# Patient Record
Sex: Female | Born: 1980 | Race: Black or African American | Hispanic: No | Marital: Married | Smoking: Never smoker
Health system: Southern US, Community
[De-identification: ages and names within clinical notes are randomized; demographics above are authoritative.]

## PROBLEM LIST (undated history)

## (undated) ENCOUNTER — Inpatient Hospital Stay (HOSPITAL_COMMUNITY): Payer: Self-pay

## (undated) DIAGNOSIS — K219 Gastro-esophageal reflux disease without esophagitis: Secondary | ICD-10-CM

## (undated) DIAGNOSIS — O139 Gestational [pregnancy-induced] hypertension without significant proteinuria, unspecified trimester: Secondary | ICD-10-CM

## (undated) DIAGNOSIS — D649 Anemia, unspecified: Secondary | ICD-10-CM

---
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
## 2009-05-21 ENCOUNTER — Emergency Department (HOSPITAL_BASED_OUTPATIENT_CLINIC_OR_DEPARTMENT_OTHER): Admission: EM | Admit: 2009-05-21 | Discharge: 2009-05-21 | Payer: Self-pay | Admitting: Emergency Medicine

## 2010-10-02 ENCOUNTER — Emergency Department (HOSPITAL_BASED_OUTPATIENT_CLINIC_OR_DEPARTMENT_OTHER)
Admission: EM | Admit: 2010-10-02 | Discharge: 2010-10-02 | Disposition: A | Discharge status: Home / Self Care | Payer: BC Managed Care – PPO | Attending: Emergency Medicine | Admitting: Emergency Medicine

## 2010-10-02 DIAGNOSIS — B9789 Other viral agents as the cause of diseases classified elsewhere: Secondary | ICD-10-CM | POA: Insufficient documentation

## 2010-10-02 DIAGNOSIS — R509 Fever, unspecified: Secondary | ICD-10-CM | POA: Insufficient documentation

## 2011-08-30 ENCOUNTER — Encounter (HOSPITAL_COMMUNITY): Payer: Self-pay | Admitting: *Deleted

## 2011-09-01 ENCOUNTER — Encounter (HOSPITAL_COMMUNITY): Payer: Self-pay | Admitting: Pharmacist

## 2011-09-09 ENCOUNTER — Other Ambulatory Visit: Payer: Self-pay | Admitting: Obstetrics & Gynecology

## 2011-09-09 NOTE — H&P (Incomplete)
Latonyia NESBIT is an 31 y.o. female. ***  Pertinent Gynecological History: Menses: {menses:16152} Bleeding: {uterine bleeding:32112} Contraception: {contraception:5051} DES exposure: {denies/unknown:32108} Blood transfusions: {none:33079} Sexually transmitted diseases: {std risk:32110} Previous GYN Procedures: {previous procedures:3041388}  Last mammogram: {normal/abnormal***:32111} Date: *** Last pap: {normal/abnormal***:32111} Date: *** OB History: G***, P***   Menstrual History: Menarche age: *** Patient's last menstrual period was 07/13/2011.    No past medical history on file.  Past Surgical History  Procedure Date  . Cesarean section   . Breast enhancement surgery 2001    No family history on file.  Social History:  reports that she has never smoked. She does not have any smokeless tobacco history on file. She reports that she does not drink alcohol or use illicit drugs.  Allergies: No Known Allergies   (Not in a hospital admission)  ROS  Last menstrual period 07/13/2011. Physical Exam  No results found for this or any previous visit (from the past 24 hour(s)).  No results found.  Assessment/Plan: ***  Yasenia Reedy H. 09/09/2011, 5:32 PM

## 2011-09-09 NOTE — H&P (Signed)
Kimberly Douglas is an 31 y.o. female. Here for preoperative evaluation for removal of IUD. History of present illness: Kimberly Douglas is a 31-year-old gravida 1 para 1001 that recently started him on monanessa oral contraceptive combination hormones and cyclic. She did this to take advantage of the fact that she has an IUD that she wants removed. She is having no problems and is trying to lose weight as well.  The IUD itself is a string itself is not visual on exam no palpable. An ultrasound revealed small fibroids and that the IUD was in place but the string was 1.7 cm from the external os. The IUD was placed in January of 08. 2 attempts were made to remove this in the office initially with the string could not be visualized and a second time with the use of a IUD removal one C-section secondary to fetal heart rate deceleration instrument /hook. That is why we are to remove this in the operating room under hysteroscopic guidance and potentially ultrasound guidance.  The patient was counseled regarding the risks benefits and alternatives of the procedure.  The risks of general anesthesia to be further delineated per anesthesia, but do include a potential for a reaction that could be life-threatening, the potential for aspiration and subsequent pneumonia and prolonged hospital stay. The risks of the procedure itself to include infection, perforation, possible laparoscopy, possible damage to vessels with possible removal of the uterus to control bleeding and possibly been unsuccessful in removing the IUD. In the rare event of a transfusion she is aware of this being only used when necessary and the associated risks of HIV, hepatitis, another on and any unknown potential infections but also the potential for a transfusion reaction where her body rejects the blood when we think she needs it All questions were answered the patient wished to proceed. OB history: One C-section at term secondary to fetal heart rate  deceleration  GYN history: Denies history of sexually transmitted diseases. Last Pap smear January 2013 negative Past medical history 1 obesity BMI 37    2. 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 Family history her know free bleeders she denies cancer history of clotting disorders in her family. Paternal grandmother has congestive heart failure  Pertinent Gynecological History: Menses: flow is light Bleeding: na Contraception: IUD and OCP (estrogen/progesterone) DES exposure: denies Blood transfusions: none Sexually transmitted diseases: no past history Previous GYN Procedures: none  Last mammogram: na Date: na Last pap: normal Date: 1/13 OB History: G1, P1   Menstrual History: Menarche age: @14 Patient's last menstrual period was 07/13/2011.    No past medical history on file.  Past Surgical History  Procedure Date  . Cesarean section   . Breast enhancement surgery 2001    No family history on file.  Social History:  reports that she has never smoked. She does not have any smokeless tobacco history on file. She reports that she does not drink alcohol or use illicit drugs.  Allergies: No Known Allergies   (Not in a hospital admission)  Review of Systems  Constitutional: Negative for fever, chills and weight loss.  HENT: Negative for hearing loss, congestion and sore throat.   Eyes: Negative for blurred vision, pain and redness.  Respiratory: Negative for cough and shortness   of breath.   Cardiovascular: Negative for chest pain and palpitations.  Gastrointestinal: Negative for nausea, vomiting, diarrhea, constipation and blood in stool.  Genitourinary: Negative for dysuria and hematuria.  Musculoskeletal: Negative  for back pain.  Skin: Negative for rash.  Neurological: Negative for dizziness, tingling, seizures and headaches.  Psychiatric/Behavioral: Negative for depression and suicidal ideas.    Last menstrual period 07/13/2011. Physical Exam  Constitutional: She is oriented to person, place, and time. She appears well-developed and well-nourished.  HENT:  Head: Normocephalic and atraumatic.  Nose: Nose normal.  Mouth/Throat: Oropharynx is clear and moist.  Eyes: Conjunctivae and EOM are normal. Pupils are equal, round, and reactive to light. Right eye exhibits no discharge. Left eye exhibits no discharge.  Neck: Normal range of motion. Neck supple. No tracheal deviation present. No thyromegaly present.  Cardiovascular: Normal rate, regular rhythm and normal heart sounds.   Respiratory: Effort normal and breath sounds normal. No respiratory distress. She has no wheezes.  GI: Soft. She exhibits no distension and no mass. There is no tenderness. There is no rebound and no guarding.  Genitourinary: Vagina normal and uterus normal. No vaginal discharge found.  Musculoskeletal: Normal range of motion. She exhibits no edema.  Lymphadenopathy:    She has no cervical adenopathy.  Neurological: She is alert and oriented to person, place, and time. No cranial nerve deficit.  Skin: Skin is warm and dry. No rash noted.  Psychiatric: She has a normal mood and affect.    No results found for this or any previous visit (from the past 24 hour(s)).  No results found.  Assessment/Plan:   I UD complication as above Plan: Will remove the IUD in the operating room under hysteroscopic and possible ultrasound guidance necessary. I do not believe we'll need the ultrasound. I do not believe laparoscopy will be needed.  Manasvi Dickard H. 09/09/2011, 5:19 PM  

## 2011-09-14 ENCOUNTER — Encounter (HOSPITAL_COMMUNITY): Payer: Self-pay | Admitting: *Deleted

## 2011-09-14 ENCOUNTER — Other Ambulatory Visit: Payer: Self-pay | Admitting: Obstetrics and Gynecology

## 2011-09-14 ENCOUNTER — Encounter: Payer: Self-pay | Admitting: Obstetrics & Gynecology

## 2011-09-14 ENCOUNTER — Ambulatory Visit (HOSPITAL_COMMUNITY)
Admission: RE | Admit: 2011-09-14 | Discharge: 2011-09-14 | Disposition: A | Discharge status: Home Health | Payer: Medicaid Other | Source: Ambulatory Visit | Attending: Obstetrics and Gynecology | Admitting: Obstetrics and Gynecology

## 2011-09-14 ENCOUNTER — Encounter (HOSPITAL_COMMUNITY): Payer: Self-pay | Admitting: Anesthesiology

## 2011-09-14 ENCOUNTER — Encounter (HOSPITAL_COMMUNITY)
Admission: RE | Disposition: A | Discharge status: Home Health | Payer: Self-pay | Source: Ambulatory Visit | Attending: Obstetrics and Gynecology

## 2011-09-14 ENCOUNTER — Other Ambulatory Visit: Payer: Self-pay | Admitting: Obstetrics & Gynecology

## 2011-09-14 ENCOUNTER — Ambulatory Visit (HOSPITAL_COMMUNITY): Payer: Medicaid Other | Admitting: Anesthesiology

## 2011-09-14 DIAGNOSIS — T839XXA Unspecified complication of genitourinary prosthetic device, implant and graft, initial encounter: Secondary | ICD-10-CM

## 2011-09-14 DIAGNOSIS — Z30432 Encounter for removal of intrauterine contraceptive device: Secondary | ICD-10-CM | POA: Insufficient documentation

## 2011-09-14 HISTORY — PX: HYSTEROSCOPY WITH D & C: SHX1775

## 2011-09-14 HISTORY — PX: IUD REMOVAL: SHX5392

## 2011-09-14 LAB — CBC
HCT: 40 % (ref 36.0–46.0)
Hemoglobin: 13.2 g/dL (ref 12.0–15.0)
MCV: 93.5 fL (ref 78.0–100.0)
RBC: 4.28 MIL/uL (ref 3.87–5.11)
WBC: 7 10*3/uL (ref 4.0–10.5)

## 2011-09-14 SURGERY — DILATATION AND CURETTAGE /HYSTEROSCOPY
Anesthesia: Choice

## 2011-09-14 SURGERY — HYSTEROSCOPY
Anesthesia: Choice

## 2011-09-14 MED ORDER — FENTANYL CITRATE 0.05 MG/ML IJ SOLN
INTRAMUSCULAR | Status: AC
  Filled 2011-09-14: qty 2

## 2011-09-14 MED ORDER — LIDOCAINE HCL 2 % IJ SOLN
INTRAMUSCULAR | Status: DC | PRN
  Administered 2011-09-14: 20 mL

## 2011-09-14 MED ORDER — PROPOFOL 10 MG/ML IV EMUL
INTRAVENOUS | Status: DC | PRN
  Administered 2011-09-14: 200 mg via INTRAVENOUS

## 2011-09-14 MED ORDER — KETOROLAC TROMETHAMINE 30 MG/ML IJ SOLN
INTRAMUSCULAR | Status: DC | PRN
  Administered 2011-09-14: 30 mg via INTRAVENOUS

## 2011-09-14 MED ORDER — PROPOFOL 10 MG/ML IV EMUL
INTRAVENOUS | Status: AC
  Filled 2011-09-14: qty 20

## 2011-09-14 MED ORDER — KETOROLAC TROMETHAMINE 30 MG/ML IJ SOLN: 15.0000 mg | Freq: Once | INTRAMUSCULAR | Status: DC | PRN

## 2011-09-14 MED ORDER — DOXYCYCLINE HYCLATE 100 MG IV SOLR
200.0000 mg | INTRAVENOUS | Status: DC | PRN
  Administered 2011-09-14: 200 mg via INTRAVENOUS

## 2011-09-14 MED ORDER — DEXAMETHASONE SODIUM PHOSPHATE 4 MG/ML IJ SOLN
INTRAMUSCULAR | Status: DC | PRN
  Administered 2011-09-14: 10 mg via INTRAVENOUS

## 2011-09-14 MED ORDER — FENTANYL CITRATE 0.05 MG/ML IJ SOLN
INTRAMUSCULAR | Status: DC | PRN
  Administered 2011-09-14: 100 ug via INTRAVENOUS

## 2011-09-14 MED ORDER — FENTANYL CITRATE 0.05 MG/ML IJ SOLN: 25.0000 ug | INTRAMUSCULAR | Status: DC | PRN

## 2011-09-14 MED ORDER — MUPIROCIN 2 % EX OINT: TOPICAL_OINTMENT | Freq: Two times a day (BID) | CUTANEOUS | Status: DC

## 2011-09-14 MED ORDER — LACTATED RINGERS IV SOLN
INTRAVENOUS | Status: DC
  Administered 2011-09-14 (×2): via INTRAVENOUS

## 2011-09-14 MED ORDER — ACETAMINOPHEN 325 MG PO TABS: 325.0000 mg | ORAL_TABLET | ORAL | Status: DC | PRN

## 2011-09-14 MED ORDER — LIDOCAINE HCL (CARDIAC) 20 MG/ML IV SOLN
INTRAVENOUS | Status: AC
  Filled 2011-09-14: qty 5

## 2011-09-14 MED ORDER — ONDANSETRON HCL 4 MG/2ML IJ SOLN
INTRAMUSCULAR | Status: AC
  Filled 2011-09-14: qty 2

## 2011-09-14 MED ORDER — MIDAZOLAM HCL 2 MG/2ML IJ SOLN
INTRAMUSCULAR | Status: AC
  Filled 2011-09-14: qty 2

## 2011-09-14 MED ORDER — ONDANSETRON HCL 4 MG/2ML IJ SOLN
INTRAMUSCULAR | Status: DC | PRN
  Administered 2011-09-14: 4 mg via INTRAVENOUS

## 2011-09-14 MED ORDER — LIDOCAINE HCL (CARDIAC) 20 MG/ML IV SOLN
INTRAVENOUS | Status: DC | PRN
  Administered 2011-09-14: 100 mg via INTRAVENOUS

## 2011-09-14 MED ORDER — DOXYCYCLINE HYCLATE 100 MG IV SOLR: 200.0000 mg | INTRAVENOUS | Status: AC

## 2011-09-14 MED ORDER — DEXAMETHASONE SODIUM PHOSPHATE 10 MG/ML IJ SOLN
INTRAMUSCULAR | Status: AC
  Filled 2011-09-14: qty 1

## 2011-09-14 MED ORDER — MIDAZOLAM HCL 5 MG/5ML IJ SOLN
INTRAMUSCULAR | Status: DC | PRN
  Administered 2011-09-14: 2 mg via INTRAVENOUS

## 2011-09-14 MED ORDER — LIDOCAINE HCL 2 % IJ SOLN
INTRAMUSCULAR | Status: AC
  Filled 2011-09-14: qty 1

## 2011-09-14 MED ORDER — DOXYCYCLINE HYCLATE 100 MG IV SOLR
200.0000 mg | INTRAVENOUS | Status: DC
  Filled 2011-09-14: qty 200

## 2011-09-14 MED ORDER — KETOROLAC TROMETHAMINE 60 MG/2ML IM SOLN
INTRAMUSCULAR | Status: AC
  Filled 2011-09-14: qty 2

## 2011-09-14 SURGICAL SUPPLY — 13 items
CANISTER SUCTION 2500CC (MISCELLANEOUS) ×2 IMPLANT
CATH ROBINSON RED A/P 16FR (CATHETERS) ×2 IMPLANT
CLOTH BEACON ORANGE TIMEOUT ST (SAFETY) ×2 IMPLANT
CONTAINER PREFILL 10% NBF 60ML (FORM) ×4 IMPLANT
DILATOR CANAL MILEX (MISCELLANEOUS) IMPLANT
GLOVE BIO SURGEON STRL SZ7 (GLOVE) ×2 IMPLANT
GLOVE BIOGEL PI IND STRL 7.0 (GLOVE) ×2 IMPLANT
GLOVE BIOGEL PI INDICATOR 7.0 (GLOVE) ×2
GOWN PREVENTION PLUS LG XLONG (DISPOSABLE) ×2 IMPLANT
GOWN STRL REIN XL XLG (GOWN DISPOSABLE) ×2 IMPLANT
PACK HYSTEROSCOPY LF (CUSTOM PROCEDURE TRAY) ×2 IMPLANT
TOWEL OR 17X24 6PK STRL BLUE (TOWEL DISPOSABLE) ×4 IMPLANT
WATER STERILE IRR 1000ML POUR (IV SOLUTION) ×2 IMPLANT

## 2011-09-14 NOTE — Discharge Instructions (Signed)

## 2011-09-14 NOTE — H&P (Unsigned)
     ROS  Physical Exam      Kimberly Douglas H. 09/14/2011, 8:50 AM

## 2011-09-14 NOTE — Anesthesia Procedure Notes (Signed)
Procedure Name: LMA Insertion Date/Time: 09/14/2011 12:36 PM Performed by: Aragon Scarantino MARIE Pre-anesthesia Checklist: Patient identified, Patient being monitored, Emergency Drugs available, Suction available and Timeout performed Patient Re-evaluated:Patient Re-evaluated prior to inductionOxygen Delivery Method: Circle system utilized Preoxygenation: Pre-oxygenation with 100% oxygen Intubation Type: IV induction Ventilation: Mask ventilation without difficulty LMA: LMA inserted LMA Size: 4.0 Number of attempts: 1 Dental Injury: Teeth and Oropharynx as per pre-operative assessment

## 2011-09-14 NOTE — Anesthesia Postprocedure Evaluation (Signed)
Anesthesia Post Note  Patient: Kimberly Douglas  Procedure(s) Performed: Procedure(s) (LRB): DILATATION AND CURETTAGE /HYSTEROSCOPY (N/A) INTRAUTERINE DEVICE (IUD) REMOVAL (N/A)  Anesthesia type: GA  Patient location: PACU  Post pain: Pain level controlled  Post assessment: Post-op Vital signs reviewed  Last Vitals:  Filed Vitals:   09/14/11 1043  BP: 135/92  Pulse: 74  Temp: 37.1 C  Resp: 18    Post vital signs: Reviewed  Level of consciousness: sedated  Complications: No apparent anesthesia complications

## 2011-09-14 NOTE — Transfer of Care (Signed)
Immediate Anesthesia Transfer of Care Note  Patient: Kimberly Douglas  Procedure(s) Performed: Procedure(s) (LRB): DILATATION AND CURETTAGE /HYSTEROSCOPY (N/A) INTRAUTERINE DEVICE (IUD) REMOVAL (N/A)  Patient Location: PACU  Anesthesia Type: General  Level of Consciousness: awake, alert  and oriented  Airway & Oxygen Therapy: Patient Spontanous Breathing and Patient connected to nasal cannula oxygen  Post-op Assessment: Report given to PACU RN, Post -op Vital signs reviewed and stable and Patient able to stick tongue midline  Post vital signs: Reviewed and stable  Complications: No apparent anesthesia complications

## 2011-09-14 NOTE — Anesthesia Preprocedure Evaluation (Signed)
Anesthesia Evaluation  Patient identified by MRN, date of birth, ID band Patient awake    Reviewed: Allergy & Precautions, H&P , Patient's Chart, lab work & pertinent test results, reviewed documented beta blocker date and time   History of Anesthesia Complications Negative for: history of anesthetic complications  Airway Mallampati: II TM Distance: >3 FB Neck ROM: full    Dental No notable dental hx.    Pulmonary neg pulmonary ROS,  breath sounds clear to auscultation  Pulmonary exam normal       Cardiovascular Exercise Tolerance: Good negative cardio ROS  Rhythm:regular Rate:Normal     Neuro/Psych negative neurological ROS  negative psych ROS   GI/Hepatic negative GI ROS, Neg liver ROS,   Endo/Other  negative endocrine ROS  Renal/GU negative Renal ROS     Musculoskeletal   Abdominal   Peds  Hematology negative hematology ROS (+)   Anesthesia Other Findings   Reproductive/Obstetrics negative OB ROS                           Anesthesia Physical Anesthesia Plan  ASA: II  Anesthesia Plan: General LMA   Post-op Pain Management:    Induction:   Airway Management Planned:   Additional Equipment:   Intra-op Plan:   Post-operative Plan:   Informed Consent: I have reviewed the patients History and Physical, chart, labs and discussed the procedure including the risks, benefits and alternatives for the proposed anesthesia with the patient or authorized representative who has indicated his/her understanding and acceptance.   Dental Advisory Given  Plan Discussed with: CRNA, Surgeon and Anesthesiologist  Anesthesia Plan Comments:         Anesthesia Quick Evaluation  

## 2011-09-14 NOTE — Brief Op Note (Signed)
09/14/2011  1:28 PM  PATIENT:  Judye NESBIT  31 y.o. female  PRE-OPERATIVE DIAGNOSIS:  IUD removal  POST-OPERATIVE DIAGNOSIS:  IUD removal   PROCEDURE:  Procedure(s) (LRB): DILATATION AND CURETTAGE /HYSTEROSCOPY (N/A) INTRAUTERINE DEVICE (IUD) REMOVAL (N/A)  SURGEON:  Surgeon(s) and Role:    * Basil Blakesley H. Christell Constant, MD - Primary  PHYSICIAN ASSISTANT:   ASSISTANTS: none   ANESTHESIA:   general  EBL:  Total I/O In: -  Out: 52 [Urine:50; Blood:2]  BLOOD ADMINISTERED:none  DRAINS: none   LOCAL MEDICATIONS USED:  NONE  SPECIMEN:  Source of Specimen:  IUD  DISPOSITION OF SPECIMEN:  PATHOLOGY  COUNTS:  YES  TOURNIQUET:  * No tourniquets in log *  DICTATION: .Note written in EPIC  PLAN OF CARE: Discharge to home after PACU  PATIENT DISPOSITION:  home   Delay start of Pharmacological VTE agent (>24hrs) due to surgical blood loss or risk of bleeding: no delay

## 2011-09-14 NOTE — Interval H&P Note (Signed)
History and Physical Interval Note:  09/14/2011 12:18 PM  Kimberly Douglas  has presented today for surgery, with the diagnosis of IUD removal  The various methods of treatment have been discussed with the patient and family. After consideration of risks, benefits and other options for treatment, the patient has consented to  Procedure(s) (LRB): DILATATION AND CURETTAGE /HYSTEROSCOPY (N/A) INTRAUTERINE DEVICE (IUD) REMOVAL (N/A) as a surgical intervention .  The patients' history has been reviewed, patient examined, no change in status, stable for surgery.  I have reviewed the patients' chart and labs.  Questions were answered to the patient's satisfaction.     Kimberly Rhine H.  I have seen the patient and all questions asked and answered and pt wishes to proceed  No change in plan

## 2011-09-14 NOTE — H&P (View-Only) (Signed)
Kimberly Douglas is an 31 y.o. female. Here for preoperative evaluation for removal of IUD. History of present illness: Kimberly Douglas is a 31 year old gravida 1 para 1001 that recently started him on monanessa oral contraceptive combination hormones and cyclic. She did this to take advantage of the fact that she has an IUD that she wants removed. She is having no problems and is trying to lose weight as well.  The IUD itself is a string itself is not visual on exam no palpable. An ultrasound revealed small fibroids and that the IUD was in place but the string was 1.7 cm from the external os. The IUD was placed in January of 08. 2 attempts were made to remove this in the office initially with the string could not be visualized and a second time with the use of a IUD removal one C-section secondary to fetal heart rate deceleration instrument /hook. That is why we are to remove this in the operating room under hysteroscopic guidance and potentially ultrasound guidance.  The patient was counseled regarding the risks benefits and alternatives of the procedure.  The risks of general anesthesia to be further delineated per anesthesia, but do include a potential for a reaction that could be life-threatening, the potential for aspiration and subsequent pneumonia and prolonged hospital stay. The risks of the procedure itself to include infection, perforation, possible laparoscopy, possible damage to vessels with possible removal of the uterus to control bleeding and possibly been unsuccessful in removing the IUD. In the rare event of a transfusion she is aware of this being only used when necessary and the associated risks of HIV, hepatitis, another on and any unknown potential infections but also the potential for a transfusion reaction where her body rejects the blood when we think she needs it All questions were answered the patient wished to proceed. OB history: One C-section at term secondary to fetal heart rate  deceleration  GYN history: Denies history of sexually transmitted diseases. Last Pap smear January 2013 negative Past medical history 1 obesity BMI 37    2. 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 Family history her know free bleeders she denies cancer history of clotting disorders in her family. Paternal grandmother has congestive heart failure  Pertinent Gynecological History: Menses: flow is light Bleeding: na Contraception: IUD and OCP (estrogen/progesterone) DES exposure: denies Blood transfusions: none Sexually transmitted diseases: no past history Previous GYN Procedures: none  Last mammogram: na Date: na Last pap: normal Date: 1/13 OB History: G1, P1   Menstrual History: Menarche age: @14  Patient's last menstrual period was 07/13/2011.    No past medical history on file.  Past Surgical History  Procedure Date  . Cesarean section   . Breast enhancement surgery 2001    No family history on file.  Social History:  reports that she has never smoked. She does not have any smokeless tobacco history on file. She reports that she does not drink alcohol or use illicit drugs.  Allergies: No Known Allergies   (Not in a hospital admission)  Review of Systems  Constitutional: Negative for fever, chills and weight loss.  HENT: Negative for hearing loss, congestion and sore throat.   Eyes: Negative for blurred vision, pain and redness.  Respiratory: Negative for cough and shortness  of breath.   Cardiovascular: Negative for chest pain and palpitations.  Gastrointestinal: Negative for nausea, vomiting, diarrhea, constipation and blood in stool.  Genitourinary: Negative for dysuria and hematuria.  Musculoskeletal: Negative  for back pain.  Skin: Negative for rash.  Neurological: Negative for dizziness, tingling, seizures and headaches.  Psychiatric/Behavioral: Negative for depression and suicidal ideas.    Last menstrual period 07/13/2011. Physical Exam  Constitutional: She is oriented to person, place, and time. She appears well-developed and well-nourished.  HENT:  Head: Normocephalic and atraumatic.  Nose: Nose normal.  Mouth/Throat: Oropharynx is clear and moist.  Eyes: Conjunctivae and EOM are normal. Pupils are equal, round, and reactive to light. Right eye exhibits no discharge. Left eye exhibits no discharge.  Neck: Normal range of motion. Neck supple. No tracheal deviation present. No thyromegaly present.  Cardiovascular: Normal rate, regular rhythm and normal heart sounds.   Respiratory: Effort normal and breath sounds normal. No respiratory distress. She has no wheezes.  GI: Soft. She exhibits no distension and no mass. There is no tenderness. There is no rebound and no guarding.  Genitourinary: Vagina normal and uterus normal. No vaginal discharge found.  Musculoskeletal: Normal range of motion. She exhibits no edema.  Lymphadenopathy:    She has no cervical adenopathy.  Neurological: She is alert and oriented to person, place, and time. No cranial nerve deficit.  Skin: Skin is warm and dry. No rash noted.  Psychiatric: She has a normal mood and affect.    No results found for this or any previous visit (from the past 24 hour(s)).  No results found.  Assessment/Plan:   I UD complication as above Plan: Will remove the IUD in the operating room under hysteroscopic and possible ultrasound guidance necessary. I do not believe we'll need the ultrasound. I do not believe laparoscopy will be needed.  Jashan Cotten H. 09/09/2011, 5:19 PM

## 2011-09-14 NOTE — Op Note (Signed)
09/14/2011  1:32 PM  PATIENT:  Kimberly Douglas  31 y.o. female  PRE-OPERATIVE DIAGNOSIS:  IUD removal  POST-OPERATIVE DIAGNOSIS:  IUD removal   PROCEDURE:  Procedure(s):  INTRAUTERINE DEVICE (IUD) REMOVAL  SURGEON:  Surgeon(s): Lorik Guo H. Christell Constant, MD  PHYSICIAN ASSISTANT: none  ASSISTANTS: none   ANESTHESIA:   none  EBL:  Total I/O In: -  Out: 52 [Urine:50; Blood:2]  BLOOD ADMINISTERED:none  DRAINS: none   LOCAL MEDICATIONS USED:  NONE  SPECIMEN:  Source of Specimen:  IUD  DISPOSITION OF SPECIMEN:  PATHOLOGY  COUNTS:  YES  TOURNIQUET:  * No tourniquets in log *  DICTATION: .Dragon Dictation  PLAN OF CARE: Discharge to home after PACU  PATIENT DISPOSITION:  PACU - hemodynamically stable.   Delay start of Pharmacological VTE agent (>24hrs) due to surgical blood loss or risk of bleeding:  {no delay  Description of procedure: The patient was seen in the preoperative holding area interval H&P signed. Patient was moved to the operating room. Patient underwent active time out much patient with procedure after she had undergone general anesthetic induction. The patient was prepped and draped in usual sterile fashion. A single-tooth tenaculum was placed on the anterior lip of the cervix. A small set of rectangular ring forceps were placed in the cervix and grasped blindly and is lucky enough to grab the string. IUD came right out.  The patient was replaced in the supine position and general anesthesia was reversed. The patient was taken recovery room awake in stable condition. End dictation.

## 2011-09-15 ENCOUNTER — Encounter (HOSPITAL_COMMUNITY): Payer: Self-pay | Admitting: Obstetrics & Gynecology

## 2012-03-13 ENCOUNTER — Encounter (HOSPITAL_BASED_OUTPATIENT_CLINIC_OR_DEPARTMENT_OTHER): Payer: Self-pay | Admitting: *Deleted

## 2012-03-13 ENCOUNTER — Emergency Department (HOSPITAL_BASED_OUTPATIENT_CLINIC_OR_DEPARTMENT_OTHER)
Admission: EM | Admit: 2012-03-13 | Discharge: 2012-03-13 | Disposition: A | Discharge status: Home / Self Care | Payer: Medicaid Other | Attending: Emergency Medicine | Admitting: Emergency Medicine

## 2012-03-13 DIAGNOSIS — G43909 Migraine, unspecified, not intractable, without status migrainosus: Secondary | ICD-10-CM

## 2012-03-13 MED ORDER — DIPHENHYDRAMINE HCL 25 MG PO TABS: 50.0000 mg | ORAL_TABLET | ORAL | Status: DC | PRN

## 2012-03-13 MED ORDER — PREDNISONE 20 MG PO TABS: ORAL_TABLET | ORAL | Status: AC

## 2012-03-13 MED ORDER — ONDANSETRON 8 MG PO TBDP: ORAL_TABLET | ORAL | Status: AC

## 2012-03-13 MED ORDER — OXYCODONE-ACETAMINOPHEN 5-325 MG PO TABS: 2.0000 | ORAL_TABLET | ORAL | Status: AC | PRN

## 2012-03-13 MED ORDER — METOCLOPRAMIDE HCL 10 MG PO TABS: 10.0000 mg | ORAL_TABLET | Freq: Four times a day (QID) | ORAL | Status: DC | PRN

## 2012-03-13 NOTE — ED Notes (Signed)
Pt. Reports she drove herself her today.  EDP giving Pt. Options of medications at present time.

## 2012-03-13 NOTE — ED Provider Notes (Signed)
History     CSN: [id number]  Arrival date & time 03/13/12  1244   First MD Initiated Contact with Patient 03/13/12 1335      Chief Complaint  Patient presents with  . Migraine    (Consider location/radiation/quality/duration/timing/severity/associated sxs/prior treatment) HPI This 31 year old female has a history of migraines although has not had one in several years. She states yesterday morning she had a gradual onset typical migraine for her with visual or. Her vision is now normal. She no sudden headache. She is no change in speech vision swallowing or understanding. She is no lateralizing or focal weakness numbness or. Seizures. She is no change in coordination and no vertigo. She did have some transient lightheadedness but yesterday only lasted for a few seconds at a time. She was able to drive herself to the emergency department. There is no treatment prior to arrival. Her headache pain is moderately severe. This headache is just like prior migraines. She is no fever rash, chest pain shortness breath abdominal pain or vomiting. She does have some nausea. She does not want IV fluids or shots and would like a prescription for home when treatment options were presented to her. History reviewed. No pertinent past medical history.  Past Surgical History  Procedure Date  . Cesarean section   . Breast enhancement surgery 2001  . Hysteroscopy w/d&c 09/14/2011    Procedure: DILATATION AND CURETTAGE /HYSTEROSCOPY;  Surgeon: Waverly Ferrari. Christell Constant, MD;  Location: [facility];  Service: Gynecology;  Laterality: N/A;  . Iud removal 09/14/2011    Procedure: INTRAUTERINE DEVICE (IUD) REMOVAL;  Surgeon: Waverly Ferrari. Christell Constant, MD;  Location: [facility];  Service: Gynecology;  Laterality: N/A;    No family history on file.  History  Substance Use Topics  . Smoking status: Never Smoker   . Smokeless tobacco: Not on file  . Alcohol Use: No    OB History    Grav Para Term Preterm Abortions TAB SAB Ect Mult Living                Review of Systems 10 Systems reviewed and are negative for acute change except as noted in the HPI. Allergies  Review of patient's allergies indicates no known allergies.  Home Medications   Current Outpatient Rx  Name Route Sig Dispense Refill  . VITAMIN D 2000 UNITS PO TABS Oral Take 2,000 Units by mouth daily.    Marland Kitchen DIPHENHYDRAMINE HCL 25 MG PO TABS Oral Take 2 tablets (50 mg total) by mouth every 4 (four) hours as needed (headache). 10 tablet 0  . METOCLOPRAMIDE HCL 10 MG PO TABS Oral Take 1 tablet (10 mg total) by mouth every 6 (six) hours as needed (nausea/headache). 6 tablet 0  . MONONESSA PO Oral Take 1 tablet by mouth daily. Pt to take until scheduled procedure on 09/14/11.    Marland Kitchen ONDANSETRON 8 MG PO TBDP  8mg  ODT q4 hours prn nausea 4 tablet 0  . OXYCODONE-ACETAMINOPHEN 5-325 MG PO TABS Oral Take 2 tablets by mouth every 4 (four) hours as needed for pain. 15 tablet 0  . PREDNISONE 20 MG PO TABS  3 tabs po daily x 2 days 6 tablet 0    BP 142/85  Pulse 87  Temp 98.9 F (37.2 C) (Oral)  Resp 20  SpO2 99%  Physical Exam  Nursing note and vitals reviewed. Constitutional:       Awake, alert, nontoxic appearance with baseline speech for patient.  HENT:  Head: Atraumatic.  Mouth/Throat: No  oropharyngeal exudate.  Eyes: EOM are normal. Pupils are equal, round, and reactive to light. Right eye exhibits no discharge. Left eye exhibits no discharge.  Neck: Neck supple.  Cardiovascular: Normal rate and regular rhythm.   No murmur heard. Pulmonary/Chest: Effort normal and breath sounds normal. No stridor. No respiratory distress. She has no wheezes. She has no rales. She exhibits no tenderness.  Abdominal: Soft. Bowel sounds are normal. She exhibits no mass. There is no tenderness. There is no rebound.  Musculoskeletal: She exhibits no tenderness.       Baseline ROM, moves extremities with no obvious new focal weakness. Back and neck are nontender.  Lymphadenopathy:     She has no cervical adenopathy.  Neurological:       Awake, alert, cooperative and aware of situation; motor strength bilaterally; sensation normal to light touch bilaterally; peripheral visual fields full to confrontation; no facial asymmetry; tongue midline; major cranial nerves appear intact; no pronator drift, normal finger to nose bilaterally, baseline gait without new ataxia.  Skin: No rash noted.  Psychiatric: She has a normal mood and affect.    ED Course  Procedures (including critical care time)  Labs Reviewed - No data to display No results found.   1. Migraine       MDM  Pt improved. Patient / Family / Caregiver informed of clinical course, understand medical decision-making process, and agree with plan.       Hurman Horn, MD 03/17/12 2231

## 2012-03-13 NOTE — ED Notes (Signed)
Headache since yesterday am. Dizziness. Light sensitive.

## 2012-07-08 ENCOUNTER — Emergency Department (HOSPITAL_BASED_OUTPATIENT_CLINIC_OR_DEPARTMENT_OTHER)
Admission: EM | Admit: 2012-07-08 | Discharge: 2012-07-09 | Disposition: A | Discharge status: Home / Self Care | Payer: Self-pay | Attending: Emergency Medicine | Admitting: Emergency Medicine

## 2012-07-08 ENCOUNTER — Emergency Department (HOSPITAL_BASED_OUTPATIENT_CLINIC_OR_DEPARTMENT_OTHER): Payer: Self-pay

## 2012-07-08 ENCOUNTER — Encounter (HOSPITAL_BASED_OUTPATIENT_CLINIC_OR_DEPARTMENT_OTHER): Payer: Self-pay | Admitting: *Deleted

## 2012-07-08 DIAGNOSIS — J3489 Other specified disorders of nose and nasal sinuses: Secondary | ICD-10-CM | POA: Insufficient documentation

## 2012-07-08 DIAGNOSIS — J069 Acute upper respiratory infection, unspecified: Secondary | ICD-10-CM | POA: Insufficient documentation

## 2012-07-08 LAB — PREGNANCY, URINE: Preg Test, Ur: NEGATIVE

## 2012-07-08 NOTE — ED Notes (Signed)
Cough, fever, chills, H/A onset Tues.

## 2012-07-09 MED ORDER — BENZONATATE 100 MG PO CAPS: 100.0000 mg | ORAL_CAPSULE | Freq: Three times a day (TID) | ORAL | Status: DC

## 2012-07-09 MED ORDER — FLUTICASONE PROPIONATE 50 MCG/ACT NA SUSP: 2.0000 | Freq: Every day | NASAL | Status: DC

## 2012-07-09 NOTE — ED Provider Notes (Signed)
History     CSN: [id number]  Arrival date & time 07/08/12  2248   First MD Initiated Contact with Patient 07/09/12 0025      Chief Complaint  Patient presents with  . Cough    (Consider location/radiation/quality/duration/timing/severity/associated sxs/prior treatment) Patient is a 31 y.o. female presenting with cough. The history is provided by the patient. No language interpreter was used.  Cough This is a new problem. The current episode started more than 2 days ago. The problem occurs constantly. The problem has not changed since onset.The cough is non-productive. There has been no fever. Associated symptoms include ear congestion and rhinorrhea. Pertinent negatives include no chest pain, no chills, no sweats, no weight loss, no sore throat, no myalgias, no shortness of breath and no wheezing. She has tried nothing for the symptoms. The treatment provided no relief. She is not a smoker.    History reviewed. No pertinent past medical history.  Past Surgical History  Procedure Date  . Cesarean section   . Breast enhancement surgery 2001  . Hysteroscopy w/d&c 09/14/2011    Procedure: DILATATION AND CURETTAGE /HYSTEROSCOPY;  Surgeon: Waverly Ferrari. Christell Constant, MD;  Location: [facility];  Service: Gynecology;  Laterality: N/A;  . Iud removal 09/14/2011    Procedure: INTRAUTERINE DEVICE (IUD) REMOVAL;  Surgeon: Waverly Ferrari. Christell Constant, MD;  Location: [facility];  Service: Gynecology;  Laterality: N/A;    No family history on file.  History  Substance Use Topics  . Smoking status: Never Smoker   . Smokeless tobacco: Not on file  . Alcohol Use: No    OB History    Grav Para Term Preterm Abortions TAB SAB Ect Mult Living                  Review of Systems  Constitutional: Negative for fever, chills and weight loss.  HENT: Positive for rhinorrhea. Negative for sore throat, neck pain and neck stiffness.   Respiratory: Positive for cough. Negative for shortness of breath and wheezing.   Cardiovascular:  Negative for chest pain, palpitations and leg swelling.  Musculoskeletal: Negative for myalgias.  All other systems reviewed and are negative.    Allergies  Review of patient's allergies indicates no known allergies.  Home Medications   Current Outpatient Rx  Name  Route  Sig  Dispense  Refill  . VITAMIN D 2000 UNITS PO TABS   Oral   Take 2,000 Units by mouth daily.         Marland Kitchen DIPHENHYDRAMINE HCL 25 MG PO TABS   Oral   Take 2 tablets (50 mg total) by mouth every 4 (four) hours as needed (headache).   10 tablet   0   . METOCLOPRAMIDE HCL 10 MG PO TABS   Oral   Take 1 tablet (10 mg total) by mouth every 6 (six) hours as needed (nausea/headache).   6 tablet   0   . MONONESSA PO   Oral   Take 1 tablet by mouth daily. Pt to take until scheduled procedure on 09/14/11.           BP 149/100  Pulse 76  Temp 98.5 F (36.9 C) (Oral)  Resp 20  Ht 5\' 3"  (1.6 m)  Wt 194 lb (87.998 kg)  BMI 34.37 kg/m2  SpO2 100%  Physical Exam  Constitutional: She is oriented to person, place, and time. She appears well-developed and well-nourished. No distress.  HENT:  Head: Normocephalic and atraumatic.  Mouth/Throat: Oropharynx is clear and moist.  Eyes: Conjunctivae normal are normal. Pupils are equal, round, and reactive to light.  Neck: Normal range of motion. Neck supple.  Cardiovascular: Normal rate, regular rhythm and intact distal pulses.   Pulmonary/Chest: Effort normal and breath sounds normal. She has no wheezes. She has no rales. She exhibits no tenderness.  Abdominal: Soft. Bowel sounds are normal. There is no tenderness. There is no rebound and no guarding.  Musculoskeletal: Normal range of motion.  Lymphadenopathy:    She has no cervical adenopathy.  Neurological: She is alert and oriented to person, place, and time.  Skin: Skin is warm and dry.  Psychiatric: She has a normal mood and affect.    ED Course  Procedures (including critical care time)   Labs Reviewed   PREGNANCY, URINE   Dg Chest 2 View  07/08/2012  *RADIOLOGY REPORT*  Clinical Data: Cough and fever.  CHEST - 2 VIEW  Comparison: None.  Findings: The lungs are well-aerated and clear.  There is no evidence of focal opacification, pleural effusion or pneumothorax.  The heart is normal in size; the mediastinal contour is within normal limits.  No acute osseous abnormalities are seen.  IMPRESSION: No acute cardiopulmonary process seen.   Original Report Authenticated By: Tonia Ghent, M.D.      No diagnosis found.    MDM  Will treat symptomatically for cough       Lorina Duffner K Zyasia Halbleib-Rasch, MD 07/09/12 [phone]

## 2012-09-06 ENCOUNTER — Ambulatory Visit: Payer: BC Managed Care – PPO | Admitting: Obstetrics and Gynecology

## 2012-09-27 ENCOUNTER — Ambulatory Visit: Payer: Self-pay | Admitting: Obstetrics & Gynecology

## 2012-12-13 ENCOUNTER — Ambulatory Visit: Payer: Medicaid Other | Admitting: Obstetrics & Gynecology

## 2013-06-03 ENCOUNTER — Emergency Department (HOSPITAL_BASED_OUTPATIENT_CLINIC_OR_DEPARTMENT_OTHER)
Admission: EM | Admit: 2013-06-03 | Discharge: 2013-06-03 | Disposition: A | Discharge status: Home / Self Care | Payer: Medicaid Other | Attending: Emergency Medicine | Admitting: Emergency Medicine

## 2013-06-03 ENCOUNTER — Encounter (HOSPITAL_BASED_OUTPATIENT_CLINIC_OR_DEPARTMENT_OTHER): Payer: Self-pay | Admitting: Emergency Medicine

## 2013-06-03 ENCOUNTER — Emergency Department (HOSPITAL_BASED_OUTPATIENT_CLINIC_OR_DEPARTMENT_OTHER): Payer: Medicaid Other

## 2013-06-03 DIAGNOSIS — K219 Gastro-esophageal reflux disease without esophagitis: Secondary | ICD-10-CM | POA: Insufficient documentation

## 2013-06-03 DIAGNOSIS — R1032 Left lower quadrant pain: Secondary | ICD-10-CM | POA: Insufficient documentation

## 2013-06-03 DIAGNOSIS — Z79899 Other long term (current) drug therapy: Secondary | ICD-10-CM | POA: Insufficient documentation

## 2013-06-03 DIAGNOSIS — Z3202 Encounter for pregnancy test, result negative: Secondary | ICD-10-CM | POA: Insufficient documentation

## 2013-06-03 DIAGNOSIS — R1031 Right lower quadrant pain: Secondary | ICD-10-CM | POA: Insufficient documentation

## 2013-06-03 DIAGNOSIS — R072 Precordial pain: Secondary | ICD-10-CM | POA: Insufficient documentation

## 2013-06-03 DIAGNOSIS — R079 Chest pain, unspecified: Secondary | ICD-10-CM

## 2013-06-03 HISTORY — DX: Gastro-esophageal reflux disease without esophagitis: K21.9

## 2013-06-03 LAB — COMPREHENSIVE METABOLIC PANEL
ALT: 11 U/L (ref 0–35)
AST: 11 U/L (ref 0–37)
Alkaline Phosphatase: 51 U/L (ref 39–117)
CO2: 24 mEq/L (ref 19–32)
Chloride: 102 mEq/L (ref 96–112)
GFR calc Af Amer: >90 mL/min (ref >90)
GFR calc non Af Amer: >90 mL/min (ref >90)
Glucose, Bld: 114 mg/dL — ABNORMAL HIGH (ref 70–99)
Potassium: 3.8 mEq/L (ref 3.5–5.1)
Sodium: 137 mEq/L (ref 135–145)

## 2013-06-03 LAB — URINALYSIS, ROUTINE W REFLEX MICROSCOPIC
Bilirubin Urine: NEGATIVE
Glucose, UA: NEGATIVE mg/dL
Hgb urine dipstick: NEGATIVE
Protein, ur: NEGATIVE mg/dL
Specific Gravity, Urine: 1.022 (ref 1.005–1.030)
Urobilinogen, UA: 0.2 mg/dL (ref 0.0–1.0)

## 2013-06-03 LAB — CBC WITH DIFFERENTIAL/PLATELET
Lymphocytes Relative: 32 % (ref 12–46)
Lymphs Abs: 2.3 10*3/uL (ref 0.7–4.0)
MCV: 92.6 fL (ref 78.0–100.0)
Neutro Abs: 4.3 10*3/uL (ref 1.7–7.7)
Neutrophils Relative %: 61 % (ref 43–77)
Platelets: 317 10*3/uL (ref 150–400)
RBC: 3.92 MIL/uL (ref 3.87–5.11)
WBC: 7.1 10*3/uL (ref 4.0–10.5)

## 2013-06-03 LAB — URINE MICROSCOPIC-ADD ON

## 2013-06-03 MED ORDER — CEPHALEXIN 500 MG PO CAPS: 500.0000 mg | ORAL_CAPSULE | Freq: Four times a day (QID) | ORAL | Status: DC

## 2013-06-03 MED ORDER — MORPHINE SULFATE 4 MG/ML IJ SOLN
4.0000 mg | Freq: Once | INTRAMUSCULAR | Status: AC
  Administered 2013-06-03: 4 mg via INTRAVENOUS
  Filled 2013-06-03: qty 1

## 2013-06-03 MED ORDER — FAMOTIDINE IN NACL 20-0.9 MG/50ML-% IV SOLN
20.0000 mg | Freq: Once | INTRAVENOUS | Status: AC
  Administered 2013-06-03: 20 mg via INTRAVENOUS
  Filled 2013-06-03: qty 50

## 2013-06-03 MED ORDER — ONDANSETRON HCL 4 MG/2ML IJ SOLN
4.0000 mg | Freq: Once | INTRAMUSCULAR | Status: AC
  Administered 2013-06-03: 4 mg via INTRAVENOUS
  Filled 2013-06-03: qty 2

## 2013-06-03 MED ORDER — OMEPRAZOLE 20 MG PO CPDR: 20.0000 mg | DELAYED_RELEASE_CAPSULE | Freq: Every day | ORAL | Status: DC

## 2013-06-03 MED ORDER — GI COCKTAIL ~~LOC~~
30.0000 mL | Freq: Once | ORAL | Status: AC
  Administered 2013-06-03: 30 mL via ORAL
  Filled 2013-06-03: qty 30

## 2013-06-03 MED ORDER — TRAMADOL HCL 50 MG PO TABS: 50.0000 mg | ORAL_TABLET | Freq: Four times a day (QID) | ORAL | Status: DC | PRN

## 2013-06-03 NOTE — ED Notes (Signed)
Abdominal pain and epigastric pain since yesterday.  History of gerd.  Pt states it is getting worse.  No fever, no sob.

## 2013-06-03 NOTE — ED Provider Notes (Signed)
CSN: [id number]     Arrival date & time 06/03/13  1213 History   First MD Initiated Contact with Patient 06/03/13 1243     Chief Complaint  Patient presents with  . Abdominal Pain   (Consider location/radiation/quality/duration/timing/severity/associated sxs/prior Treatment) Patient is a 32 y.o. female presenting with abdominal pain and chest pain. The history is provided by the patient and a parent. No language interpreter was used.  Abdominal Pain Pain location:  Suprapubic, LLQ and RLQ Pain quality: aching   Pain radiates to:  Does not radiate Pain severity:  Mild Duration:  3 days Associated symptoms: chest pain   Associated symptoms: no constipation, no cough, no diarrhea, no dysuria, no fever, no melena, no nausea, no shortness of breath, no vaginal bleeding and no vaginal discharge   Chest Pain Pain location:  Substernal area Pain quality: burning and sharp   Pain radiates to the back: yes   Associated symptoms: abdominal pain   Associated symptoms: no cough, no fever, no nausea and no shortness of breath   Associated symptoms comment:  Since last night she complains of sharp chest pain that reminds her of uncontrolled symptoms of her GERD. She take Nexium or Prilosec when symptoms occur and states that it failed to relieve symptoms this time. No vomiting.    Past Medical History  Diagnosis Date  . GERD (gastroesophageal reflux disease)    Past Surgical History  Procedure Laterality Date  . Cesarean section    . Breast enhancement surgery  2001  . Hysteroscopy w/d&c  09/14/2011    Procedure: DILATATION AND CURETTAGE /HYSTEROSCOPY;  Surgeon: Waverly Ferrari. Christell Constant, MD;  Location: [facility];  Service: Gynecology;  Laterality: N/A;  . Iud removal  09/14/2011    Procedure: INTRAUTERINE DEVICE (IUD) REMOVAL;  Surgeon: Waverly Ferrari. Christell Constant, MD;  Location: [facility];  Service: Gynecology;  Laterality: N/A;   No family history on file. History  Substance Use Topics  . Smoking status: Never Smoker    . Smokeless tobacco: Not on file  . Alcohol Use: No   OB History   Grav Para Term Preterm Abortions TAB SAB Ect Mult Living                 Review of Systems  Constitutional: Negative for fever.  Respiratory: Negative for cough and shortness of breath.   Cardiovascular: Positive for chest pain.  Gastrointestinal: Positive for abdominal pain. Negative for nausea, diarrhea, constipation and melena.  Genitourinary: Negative for dysuria, vaginal bleeding and vaginal discharge.    Allergies  Review of patient's allergies indicates no known allergies.  Home Medications   Current Outpatient Rx  Name  Route  Sig  Dispense  Refill  . esomeprazole (NEXIUM) 40 MG capsule   Oral   Take 40 mg by mouth daily at 12 noon.         Marland Kitchen omeprazole (PRILOSEC) 20 MG capsule   Oral   Take 20 mg by mouth daily.          BP 161/89  Pulse 90  Temp(Src) 98.7 F (37.1 C) (Oral)  Resp 16  Ht 5\' 3"  (1.6 m)  Wt 217 lb (98.431 kg)  BMI 38.45 kg/m2  SpO2 100%  LMP 05/15/2013 Physical Exam  Constitutional: She appears well-developed and well-nourished.  HENT:  Head: Normocephalic.  Neck: Normal range of motion. Neck supple.  Cardiovascular: Normal rate and regular rhythm.   Pulmonary/Chest: Effort normal and breath sounds normal.  Abdominal: Soft. Bowel sounds are normal. There  is tenderness. There is no rebound and no guarding.  Lower abdominal tenderness that is mild. NO rebound or guarding. No distention.  Musculoskeletal: Normal range of motion.  Neurological: She is alert. No cranial nerve deficit.  Skin: Skin is warm and dry. No rash noted.  Psychiatric: She has a normal mood and affect.    ED Course  Procedures (including critical care time) Labs Review Labs Reviewed  CBC WITH DIFFERENTIAL  COMPREHENSIVE METABOLIC PANEL  LIPASE, BLOOD  URINALYSIS, ROUTINE W REFLEX MICROSCOPIC  PREGNANCY, URINE   Results for orders placed during the hospital encounter of 06/03/13  CBC  WITH DIFFERENTIAL      Result Value Range   WBC 7.1  4.0 - 10.5 K/uL   RBC 3.92  3.87 - 5.11 MIL/uL   Hemoglobin 11.9 (*) 12.0 - 15.0 g/dL   HCT 96.0  45.4 - 09.8 %   MCV 92.6  78.0 - 100.0 fL   MCH 30.4  26.0 - 34.0 pg   MCHC 32.8  30.0 - 36.0 g/dL   RDW 11.9  14.7 - 82.9 %   Platelets 317  150 - 400 K/uL   Neutrophils Relative % 61  43 - 77 %   Neutro Abs 4.3  1.7 - 7.7 K/uL   Lymphocytes Relative 32  12 - 46 %   Lymphs Abs 2.3  0.7 - 4.0 K/uL   Monocytes Relative 6  3 - 12 %   Monocytes Absolute 0.4  0.1 - 1.0 K/uL   Eosinophils Relative 1  0 - 5 %   Eosinophils Absolute 0.1  0.0 - 0.7 K/uL   Basophils Relative 0  0 - 1 %   Basophils Absolute 0.0  0.0 - 0.1 K/uL  COMPREHENSIVE METABOLIC PANEL      Result Value Range   Sodium 137  135 - 145 mEq/L   Potassium 3.8  3.5 - 5.1 mEq/L   Chloride 102  96 - 112 mEq/L   CO2 24  19 - 32 mEq/L   Glucose, Bld 114 (*) 70 - 99 mg/dL   BUN 12  6 - 23 mg/dL   Creatinine, Ser 5.62  0.50 - 1.10 mg/dL   Calcium 8.6  8.4 - 13.0 mg/dL   Total Protein 6.8  6.0 - 8.3 g/dL   Albumin 3.0 (*) 3.5 - 5.2 g/dL   AST 11  0 - 37 U/L   ALT 11  0 - 35 U/L   Alkaline Phosphatase 51  39 - 117 U/L   Total Bilirubin 0.2 (*) 0.3 - 1.2 mg/dL   GFR calc non Af Amer >90  >90 mL/min   GFR calc Af Amer >90  >90 mL/min  LIPASE, BLOOD      Result Value Range   Lipase 16  11 - 59 U/L  URINALYSIS, ROUTINE W REFLEX MICROSCOPIC      Result Value Range   Color, Urine YELLOW  YELLOW   APPearance CLEAR  CLEAR   Specific Gravity, Urine 1.022  1.005 - 1.030   pH 7.0  5.0 - 8.0   Glucose, UA NEGATIVE  NEGATIVE mg/dL   Hgb urine dipstick NEGATIVE  NEGATIVE   Bilirubin Urine NEGATIVE  NEGATIVE   Ketones, ur NEGATIVE  NEGATIVE mg/dL   Protein, ur NEGATIVE  NEGATIVE mg/dL   Urobilinogen, UA 0.2  0.0 - 1.0 mg/dL   Nitrite NEGATIVE  NEGATIVE   Leukocytes, UA SMALL (*) NEGATIVE  PREGNANCY, URINE      Result Value Range  Preg Test, Ur NEGATIVE  NEGATIVE  URINE  MICROSCOPIC-ADD ON      Result Value Range   Squamous Epithelial / LPF FEW (*) RARE   WBC, UA 7-10  <3 WBC/hpf   RBC / HPF 0-2  <3 RBC/hpf   Bacteria, UA MANY (*) RARE   Urine-Other MUCOUS PRESENT     Dg Chest 2 View  06/03/2013   CLINICAL DATA:  Chest pain, abdominal pain.  EXAM: CHEST  2 VIEW  COMPARISON:  07/08/2012  FINDINGS: The heart size and mediastinal contours are within normal limits. Both lungs are clear. The visualized skeletal structures are unremarkable.  IMPRESSION: No active cardiopulmonary disease.   Electronically Signed   By: Charlett Nose M.D.   On: 06/03/2013 13:56   Imaging Review No results found.  EKG Interpretation   None       MDM  No diagnosis found. 1. GERD 2. Lower abdominal pain  The symptoms of chest pain are consistent with GERD (sharp, burning, worse after certain foods, worse with reclining). EKG negative for acute change, neg labs reassuring. The patient declines pelvic exam for further evaluation of lower abdominal pain. Encouraged to follow up with PCP this week.     Arnoldo Hooker, PA-C 06/03/13 2043

## 2013-06-04 LAB — URINE CULTURE

## 2013-06-05 NOTE — ED Provider Notes (Signed)
Medical screening examination/treatment/procedure(s) were performed by non-physician practitioner and as supervising physician I was immediately available for consultation/collaboration.  EKG Interpretation    Date/Time:  Sunday June 03 2013 13:13:46 EST Ventricular Rate:  87 PR Interval:  156 QRS Duration: 72 QT Interval:  380 QTC Calculation: 457 R Axis:   42 Text Interpretation:  Normal sinus rhythm Nonspecific T wave abnormality Abnormal ECG No old tracing to compare Confirmed by Roderick Calo  MD, Ziyonna Christner ([id number]) on 06/03/2013 2:41:20 PM              Rolan Bucco, MD 06/05/13 1610

## 2013-06-14 ENCOUNTER — Encounter: Payer: Self-pay | Admitting: *Deleted

## 2013-06-22 ENCOUNTER — Encounter (HOSPITAL_COMMUNITY): Payer: Self-pay | Admitting: *Deleted

## 2013-06-22 ENCOUNTER — Inpatient Hospital Stay (HOSPITAL_COMMUNITY): Payer: Medicaid Other

## 2013-06-22 ENCOUNTER — Inpatient Hospital Stay (HOSPITAL_COMMUNITY)
Admission: AD | Admit: 2013-06-22 | Discharge: 2013-06-22 | Disposition: A | Discharge status: Home / Self Care | Payer: Medicaid Other | Source: Ambulatory Visit | Attending: Obstetrics & Gynecology | Admitting: Obstetrics & Gynecology

## 2013-06-22 DIAGNOSIS — O209 Hemorrhage in early pregnancy, unspecified: Secondary | ICD-10-CM | POA: Insufficient documentation

## 2013-06-22 LAB — URINALYSIS, ROUTINE W REFLEX MICROSCOPIC
Glucose, UA: NEGATIVE mg/dL
Ketones, ur: NEGATIVE mg/dL
Leukocytes, UA: NEGATIVE
Nitrite: NEGATIVE
Specific Gravity, Urine: >1.03 — ABNORMAL HIGH (ref 1.005–1.030)
pH: 5.5 (ref 5.0–8.0)

## 2013-06-22 LAB — WET PREP, GENITAL
Trich, Wet Prep: NONE SEEN
Yeast Wet Prep HPF POC: NONE SEEN

## 2013-06-22 LAB — CBC
HCT: 33.2 % — ABNORMAL LOW (ref 36.0–46.0)
Hemoglobin: 11.3 g/dL — ABNORMAL LOW (ref 12.0–15.0)
MCHC: 34 g/dL (ref 30.0–36.0)
MCV: 89.2 fL (ref 78.0–100.0)
RBC: 3.72 MIL/uL — ABNORMAL LOW (ref 3.87–5.11)
RDW: 12.7 % (ref 11.5–15.5)
WBC: 7.7 10*3/uL (ref 4.0–10.5)

## 2013-06-22 LAB — ABO/RH: ABO/RH(D): A POS

## 2013-06-22 LAB — HCG, QUANTITATIVE, PREGNANCY: hCG, Beta Chain, Quant, S: 327 m[IU]/mL — ABNORMAL HIGH (ref <5)

## 2013-06-22 LAB — URINE MICROSCOPIC-ADD ON

## 2013-06-22 LAB — POCT PREGNANCY, URINE: Preg Test, Ur: POSITIVE — AB

## 2013-06-22 MED ORDER — PRENATAL PLUS 27-1 MG PO TABS: 1.0000 | ORAL_TABLET | Freq: Every day | ORAL | Status: DC

## 2013-06-22 NOTE — MAU Note (Signed)
Pt presents with complaints of bright red vaginal bleeding since this morning. Pt had a positive pregnancy test on Dec the 1st.

## 2013-06-22 NOTE — MAU Provider Note (Signed)
Chief Complaint  Patient presents with  . Vaginal Bleeding    Subjective Kimberly Douglas 32 y.o.  G2P1001 at [redacted]w[redacted]d by LMP presents with onset today of first episode of small amount vaginal bleeding early this morning that started as spotting and became heavier, used 2 pads total. No cramping. Last intercourse a few days ago  Denies irritative vaginal discharge. No dysuria or hematuria.  Blood type: unknown  Pregnancy course: Pos HPT 06/11/13. Neg UPT at ED 06/03/13.   Pertinent Medical History: Pertinent Ob/Gyn History: Pertinent Surgical History: Pertinent Social History:  Prescriptions prior to admission  Medication Sig Dispense Refill  . omeprazole (PRILOSEC) 20 MG capsule Take 20 mg by mouth daily.      . Prenatal Vit-Fe Fumarate-FA (PRENATAL MULTIVITAMIN) TABS tablet Take 1 tablet by mouth daily at 12 noon.        No Known Allergies   Objective   Filed Vitals:   06/22/13 1055  BP: 135/89  Temp: 99.1 F (37.3 C)  Resp: 18     Physical Exam General: WN/WD in NAD  Abdom: soft, NT External genitalia: normal; BUS neg  SSE: small amount dark red blood; cervix with no lesions, appears closed Bimanual: Cervix closed, long; uterus anteverted, NT, ULNS, adnexa nontender, no masses   Lab Results Results for orders placed during the hospital encounter of 06/22/13 (from the past 24 hour(s))  URINALYSIS, ROUTINE W REFLEX MICROSCOPIC     Status: Abnormal   Collection Time    06/22/13 10:50 AM      Result Value Range   Color, Urine YELLOW  YELLOW   APPearance CLEAR  CLEAR   Specific Gravity, Urine >1.030 (*) 1.005 - 1.030   pH 5.5  5.0 - 8.0   Glucose, UA NEGATIVE  NEGATIVE mg/dL   Hgb urine dipstick LARGE (*) NEGATIVE   Bilirubin Urine NEGATIVE  NEGATIVE   Ketones, ur NEGATIVE  NEGATIVE mg/dL   Protein, ur NEGATIVE  NEGATIVE mg/dL   Urobilinogen, UA 0.2  0.0 - 1.0 mg/dL   Nitrite NEGATIVE  NEGATIVE   Leukocytes, UA NEGATIVE  NEGATIVE  URINE MICROSCOPIC-ADD ON      Status: Abnormal   Collection Time    06/22/13 10:50 AM      Result Value Range   Squamous Epithelial / LPF FEW (*) RARE   WBC, UA 0-2  <3 WBC/hpf   RBC / HPF 11-20  <3 RBC/hpf  POCT PREGNANCY, URINE     Status: Abnormal   Collection Time    06/22/13 11:11 AM      Result Value Range   Preg Test, Ur POSITIVE (*) NEGATIVE  CBC     Status: Abnormal   Collection Time    06/22/13 12:51 PM      Result Value Range   WBC 7.7  4.0 - 10.5 K/uL   RBC 3.72 (*) 3.87 - 5.11 MIL/uL   Hemoglobin 11.3 (*) 12.0 - 15.0 g/dL   HCT 46.9 (*) 62.9 - 52.8 %   MCV 89.2  78.0 - 100.0 fL   MCH 30.4  26.0 - 34.0 pg   MCHC 34.0  30.0 - 36.0 g/dL   RDW 41.3  24.4 - 01.0 %   Platelets 307  150 - 400 K/uL  ABO/RH     Status: None   Collection Time    06/22/13 12:51 PM      Result Value Range   ABO/RH(D) A POS    HCG, QUANTITATIVE, PREGNANCY     Status:  Abnormal   Collection Time    06/22/13 12:51 PM      Result Value Range   hCG, Beta Chain, Quant, S 327 (*) <5 mIU/mL    Ultrasound  US Ob Comp Less 14 Wks  06/22/2013   CLINICAL DATA:  Evaluate for viability.  EXAM: OBSTETRIC <14 WK [place] AND TRANSVAGINAL OB US  TECHNIQUE: Both transabdominal and transvaginal ultrasound examinations were performed for complete evaluation of the gestation as well as the maternal uterus, adnexal regions, and pelvic cul-de-sac. Transvaginal technique was performed to assess early pregnancy.  COMPARISON:  None.  FINDINGS: No definite intrauterine gestation is identified. The endometrium is mildly heterogeneous and may contain a small amount of blood products. There is a shadowing intramural fibroid within the right aspect of uterus measuring 1.3 x 1.8 x 1.4 cm.  The left ovary measures 5.0 x 4.6 x 3.2 cm. There is a 2.7 x 2.3 x 2.6 cm hemorrhagic cyst within the left ovary. The right ovary is unremarkable and measures 4.1 x 2.2 x 2.8 cm.  IMPRESSION: No definite intrauterine gestation is identified. In the setting of positive  pregnancy test and no definite intrauterine pregnancy, this reflects a pregnancy of unknown location. Differential considerations include early normal IUP, abnormal IUP, or nonvisualized ectopic pregnancy. Differentiation is achieved with serial beta HCG supplemented by repeat sonography.  Heterogeneous material within the region the endometrial canal is nonspecific but likely represents blood products in the setting of vaginal bleeding. Attention on followup.  Enlarged left ovary containing a probable hemorrhagic cyst.  These results were called by telephone at the time of interpretation on 06/22/2013 at 3:50 PM to Dr. Rada Hay Kimberly Douglas , who verbally acknowledged these results.   Electronically Signed   By: Annia Belt M.D.   On: 06/22/2013 16:09    Assessment 1. Bleeding in early pregnancy   P1001 at [redacted]w[redacted]d   Plan    GC/CT sent Discharge home with ectopic precautions See AVS for pt education   Medication List         omeprazole 20 MG capsule  Commonly known as:  PRILOSEC  Take 20 mg by mouth daily.     prenatal vitamin w/FE, FA 27-1 MG Tabs tablet  Take 1 tablet by mouth daily.         Follow-up Information   Follow up with Nursepractioner Mau, NP In 2 days. (Repeat quantitative beta hCG)    Contact information:   [phone]         Kimberly Douglas 06/22/2013 1:46 PM

## 2013-06-23 ENCOUNTER — Inpatient Hospital Stay (HOSPITAL_COMMUNITY)
Admission: AD | Admit: 2013-06-23 | Discharge: 2013-06-23 | Disposition: A | Discharge status: Home / Self Care | Payer: Medicaid Other | Source: Ambulatory Visit | Attending: Obstetrics & Gynecology | Admitting: Obstetrics & Gynecology

## 2013-06-23 ENCOUNTER — Encounter (HOSPITAL_COMMUNITY): Payer: Self-pay | Admitting: *Deleted

## 2013-06-23 DIAGNOSIS — O039 Complete or unspecified spontaneous abortion without complication: Secondary | ICD-10-CM | POA: Insufficient documentation

## 2013-06-23 LAB — GC/CHLAMYDIA PROBE AMP: CT Probe RNA: NEGATIVE

## 2013-06-23 LAB — HCG, QUANTITATIVE, PREGNANCY: hCG, Beta Chain, Quant, S: 145 m[IU]/mL — ABNORMAL HIGH (ref <5)

## 2013-06-23 NOTE — MAU Provider Note (Signed)
Chief Complaint: Vaginal Bleeding   None    SUBJECTIVE HPI: Kimberly Douglas is a 32 y.o. G2P1001 at [redacted]w[redacted]d by LMP who presents to maternity admissions reporting vaginal spotting with small clots with onset today.  She was seen in MAU yesterday for vaginal bleeding in early pregnancy with a plan to return in 48 hours for repeat labs.  She left MAU yesterday, and her bleeding stopped until today, when it started up again, lighter than yesterday but with some clots.  She denies pain, vaginal itching/burning, urinary symptoms, h/a, dizziness, n/v, or fever/chills.     Past Medical History  Diagnosis Date  . GERD (gastroesophageal reflux disease)    Past Surgical History  Procedure Laterality Date  . Cesarean section    . Breast enhancement surgery  2001  . Hysteroscopy w/d&c  09/14/2011    Procedure: DILATATION AND CURETTAGE /HYSTEROSCOPY;  Surgeon: Waverly Ferrari. Christell Constant, MD;  Location: [facility];  Service: Gynecology;  Laterality: N/A;  . Iud removal  09/14/2011    Procedure: INTRAUTERINE DEVICE (IUD) REMOVAL;  Surgeon: Waverly Ferrari. Christell Constant, MD;  Location: [facility];  Service: Gynecology;  Laterality: N/A;   History   Social History  . Marital Status: Married    Spouse Name: N/A    Number of Children: N/A  . Years of Education: N/A   Occupational History  . Not on file.   Social History Main Topics  . Smoking status: Never Smoker   . Smokeless tobacco: Never Used  . Alcohol Use: No  . Drug Use: No  . Sexual Activity: Yes    Birth Control/ Protection: None   Other Topics Concern  . Not on file   Social History Narrative  . No narrative on file   Current Facility-Administered Medications on File Prior to Encounter  Medication Dose Route Frequency Provider Last Rate Last Dose  . doxycycline (VIBRAMYCIN) 200 mg in dextrose 5 % 250 mL IVPB  200 mg Intravenous 120 min pre-op Delbert Harness, MD       Current Outpatient Prescriptions on File Prior to Encounter  Medication Sig Dispense Refill  .  omeprazole (PRILOSEC) 20 MG capsule Take 20 mg by mouth daily.      . prenatal vitamin w/FE, FA (PRENATAL 1 + 1) 27-1 MG TABS tablet Take 1 tablet by mouth daily.  30 each  0   No Known Allergies  ROS: Pertinent items in HPI  OBJECTIVE Blood pressure 125/81, pulse 95, temperature 99 F (37.2 C), temperature source Oral, resp. rate 16, height 5\' 2"  (1.575 m), weight 106.867 kg (235 lb 9.6 oz), last menstrual period 05/15/2013. GENERAL: Well-developed, well-nourished female in no acute distress.  HEENT: Normocephalic HEART: normal rate RESP: normal effort ABDOMEN: Soft, non-tender EXTREMITIES: Nontender, no edema NEURO: Alert and oriented SPECULUM EXAM: Deferred--done yesterday with lighter bleeding today upon arrival  LAB RESULTS Results for orders placed during the hospital encounter of 06/23/13 (from the past 168 hour(s))  HCG, QUANTITATIVE, PREGNANCY   Collection Time    06/23/13 10:25 PM      Result Value Range   hCG, Beta Chain, Quant, S 145 (*) <5 mIU/mL  HCG, QUANTITATIVE, PREGNANCY   Collection Time    06/22/13 12:51 PM      Result Value Range   hCG, Beta Chain, Quant, S 327 (*) <5 mIU/mL  ABO/RH   Collection Time    06/22/13 12:51 PM      Result Value Range   ABO/RH(D) A POS  IMAGING   US Ob Comp Less 14 Wks  06/22/2013   CLINICAL DATA:  Evaluate for viability.  EXAM: OBSTETRIC <14 WK [place] AND TRANSVAGINAL OB US  TECHNIQUE: Both transabdominal and transvaginal ultrasound examinations were performed for complete evaluation of the gestation as well as the maternal uterus, adnexal regions, and pelvic cul-de-sac. Transvaginal technique was performed to assess early pregnancy.  COMPARISON:  None.  FINDINGS: No definite intrauterine gestation is identified. The endometrium is mildly heterogeneous and may contain a small amount of blood products. There is a shadowing intramural fibroid within the right aspect of uterus measuring 1.3 x 1.8 x 1.4 cm.  The left ovary  measures 5.0 x 4.6 x 3.2 cm. There is a 2.7 x 2.3 x 2.6 cm hemorrhagic cyst within the left ovary. The right ovary is unremarkable and measures 4.1 x 2.2 x 2.8 cm.  IMPRESSION: No definite intrauterine gestation is identified. In the setting of positive pregnancy test and no definite intrauterine pregnancy, this reflects a pregnancy of unknown location. Differential considerations include early normal IUP, abnormal IUP, or nonvisualized ectopic pregnancy. Differentiation is achieved with serial beta HCG supplemented by repeat sonography.  Heterogeneous material within the region the endometrial canal is nonspecific but likely represents blood products in the setting of vaginal bleeding. Attention on followup.  Enlarged left ovary containing a probable hemorrhagic cyst.  These results were called by telephone at the time of interpretation on 06/22/2013 at 3:50 PM to Dr. Rada Hay POE , who verbally acknowledged these results.   Electronically Signed   By: Annia Belt M.D.   On: 06/22/2013 16:09    ASSESSMENT 1. Miscarriage     PLAN Consult Dr Macon Large Discharge home with miscarriage and ectopic precautions F/U as scheduled in [facility] next week Message sent to change appt to lab draw for quant hcg Comfort packet given by RN.  Pt and family given opportunity to ask questions. Return to MAU as needed    Medication List         omeprazole 20 MG capsule  Commonly known as:  PRILOSEC  Take 20 mg by mouth daily.     prenatal vitamin w/FE, FA 27-1 MG Tabs tablet  Take 1 tablet by mouth daily.       Follow-up Information   Follow up with [facility]. (As scheduled on Wednesday.  Return to MAU as needed.)    Specialty:  Obstetrics and Gynecology   Contact information:   [address] [place] [address] [phone]      Sharen Counter Certified Nurse-Midwife 06/23/2013  11:21 PM

## 2013-06-23 NOTE — MAU Note (Signed)
Pt G2 P1 at 5.4wks, was seen in MAU 12/12 for bleeding.  Pt reports last night the bleeding had stopped, today having spotting with small clots. Denies pain.

## 2013-06-24 NOTE — MAU Provider Note (Signed)
Attestation of Attending Supervision of Advanced Practitioner (PA/CNM/NP): Evaluation and management procedures were performed by the Advanced Practitioner under my supervision and collaboration.  I have reviewed the Advanced Practitioner's note and chart, and I agree with the management and plan.  Maliea Grandmaison, MD, FACOG Attending Obstetrician & Gynecologist Faculty Practice, [facility]   

## 2013-06-27 ENCOUNTER — Other Ambulatory Visit: Payer: Medicaid Other

## 2013-06-27 ENCOUNTER — Encounter: Payer: Medicaid Other | Admitting: Advanced Practice Midwife

## 2013-06-27 DIAGNOSIS — O039 Complete or unspecified spontaneous abortion without complication: Secondary | ICD-10-CM

## 2013-06-27 LAB — HCG, QUANTITATIVE, PREGNANCY: hCG, Beta Chain, Quant, S: 89.4 m[IU]/mL

## 2013-06-29 ENCOUNTER — Inpatient Hospital Stay (HOSPITAL_COMMUNITY)
Admission: AD | Admit: 2013-06-29 | Discharge: 2013-06-30 | Disposition: A | Discharge status: Home / Self Care | Payer: Medicaid Other | Source: Ambulatory Visit | Attending: Obstetrics and Gynecology | Admitting: Obstetrics and Gynecology

## 2013-06-29 DIAGNOSIS — R600 Localized edema: Secondary | ICD-10-CM

## 2013-06-29 DIAGNOSIS — O039 Complete or unspecified spontaneous abortion without complication: Secondary | ICD-10-CM | POA: Insufficient documentation

## 2013-06-29 DIAGNOSIS — R609 Edema, unspecified: Secondary | ICD-10-CM | POA: Insufficient documentation

## 2013-06-30 ENCOUNTER — Encounter (HOSPITAL_COMMUNITY): Payer: Self-pay | Admitting: *Deleted

## 2013-06-30 ENCOUNTER — Ambulatory Visit (HOSPITAL_COMMUNITY): Payer: Medicaid Other | Attending: Obstetrics and Gynecology

## 2013-06-30 DIAGNOSIS — O039 Complete or unspecified spontaneous abortion without complication: Secondary | ICD-10-CM

## 2013-06-30 LAB — CBC
HCT: 34 % — ABNORMAL LOW (ref 36.0–46.0)
Hemoglobin: 11.6 g/dL — ABNORMAL LOW (ref 12.0–15.0)
MCHC: 34.1 g/dL (ref 30.0–36.0)
Platelets: 323 10*3/uL (ref 150–400)
RBC: 3.78 MIL/uL — ABNORMAL LOW (ref 3.87–5.11)

## 2013-06-30 LAB — HCG, QUANTITATIVE, PREGNANCY: hCG, Beta Chain, Quant, S: 20 m[IU]/mL — ABNORMAL HIGH (ref <5)

## 2013-06-30 MED ORDER — IBUPROFEN 800 MG PO TABS: 800.0000 mg | ORAL_TABLET | Freq: Three times a day (TID) | ORAL | Status: DC | PRN

## 2013-06-30 NOTE — MAU Note (Signed)
PT instructed to go to [facility] 0800 Sat to Vascular Lab for u/s of L lower leg. CNM will send order to Vascular lab. Pt voices understanding

## 2013-06-30 NOTE — MAU Note (Signed)
Pt has had some swelling in lower legs, esp L leg this past wk. Thressa Sheller CNM in to evaluate swelling.

## 2013-06-30 NOTE — MAU Provider Note (Signed)
Attestation of Attending Supervision of Advanced Practitioner: Evaluation and management procedures were performed by the PA/NP/CNM/OB Fellow under my supervision/collaboration. Chart reviewed and agree with management and plan.  Aeon Kessner V 06/30/2013 9:44 AM

## 2013-06-30 NOTE — Progress Notes (Signed)
Thressa Sheller CNM in earlier and discussed d/c plan with pt. Written and verbal d/c instructions given and understanding voiced.

## 2013-06-30 NOTE — MAU Provider Note (Signed)
History     CSN: [id number]  Arrival date and time: 06/29/13 2338   None     Chief Complaint  Patient presents with  . Miscarriage   HPI  Kimberly Douglas is a 32 y.o. G2P1001 who presents today with vaginal bleeding. She states that she has passed a clot the size of a plum. She states that she has passed "many" other clots, and she feels pressure when she passes the clots. She states that in a 4 hour period she may have a used about 5 pads.    She also has swelling in her left leg.  Past Medical History  Diagnosis Date  . GERD (gastroesophageal reflux disease)     Past Surgical History  Procedure Laterality Date  . Cesarean section    . Breast enhancement surgery  2001  . Hysteroscopy w/d&c  09/14/2011    Procedure: DILATATION AND CURETTAGE /HYSTEROSCOPY;  Surgeon: Waverly Ferrari. Christell Constant, MD;  Location: [facility];  Service: Gynecology;  Laterality: N/A;  . Iud removal  09/14/2011    Procedure: INTRAUTERINE DEVICE (IUD) REMOVAL;  Surgeon: Waverly Ferrari. Christell Constant, MD;  Location: [facility];  Service: Gynecology;  Laterality: N/A;    Family History  Problem Relation Age of Onset  . Diabetes Father   . Heart disease Father   . Stroke Paternal Grandmother     History  Substance Use Topics  . Smoking status: Never Smoker   . Smokeless tobacco: Never Used  . Alcohol Use: No    Allergies:  Allergies  Allergen Reactions  . Morphine And Related     "burns me up inside"    Prescriptions prior to admission  Medication Sig Dispense Refill  . omeprazole (PRILOSEC) 20 MG capsule Take 20 mg by mouth daily.      . prenatal vitamin w/FE, FA (PRENATAL 1 + 1) 27-1 MG TABS tablet Take 1 tablet by mouth daily.  30 each  0    ROS Physical Exam   Blood pressure 122/72, pulse 86, temperature 98.5 F (36.9 C), resp. rate 20, height 5\' 2"  (1.575 m), weight 107.412 kg (236 lb 12.8 oz), last menstrual period 05/15/2013.  Physical Exam  Nursing note and vitals reviewed. Constitutional: She is oriented  to person, place, and time. She appears well-developed and well-nourished. No distress.  Cardiovascular: Normal rate.   Respiratory: Effort normal.  GI: Soft. There is no tenderness.  Genitourinary:   External: no lesion Vagina: small blood (used 2 faux swabs to clean it out).  Cervix: pink, smooth, no CMT Uterus: non-tender    Musculoskeletal: She exhibits edema (L>R) and tenderness (L>R).  Neurological: She is alert and oriented to person, place, and time.  Skin: Skin is warm and dry.  Psychiatric: She has a normal mood and affect.    MAU Course  Procedures   Results for Kimberly, Douglas (MRN [id number]) as of 06/30/2013 00:32  Ref. Range 06/22/2013 12:51 06/23/2013 22:25 06/27/2013 08:42 06/29/2013 23:50  hCG, Beta Chain, Quant, S Latest Range: <5 mIU/mL 327 (H) 145 (H) 89.4 20 (H)    Results for orders placed during the hospital encounter of 06/29/13 (from the past 24 hour(s))  CBC     Status: Abnormal   Collection Time    06/29/13 11:50 PM      Result Value Range   WBC 9.2  4.0 - 10.5 K/uL   RBC 3.78 (*) 3.87 - 5.11 MIL/uL   Hemoglobin 11.6 (*) 12.0 - 15.0 g/dL   HCT 08.6 (*) 57.8 -  46.0 %   MCV 89.9  78.0 - 100.0 fL   MCH 30.7  26.0 - 34.0 pg   MCHC 34.1  30.0 - 36.0 g/dL   RDW 19.1  47.8 - 29.5 %   Platelets 323  150 - 400 K/uL  HCG, QUANTITATIVE, PREGNANCY     Status: Abnormal   Collection Time    06/29/13 11:50 PM      Result Value Range   hCG, Beta Chain, Quant, S 20 (*) <5 mIU/mL    Assessment and Plan   1. SAB (spontaneous abortion)   2. Edema of left lower extremity    HCG has fallen to near non-pregnant levels HGB stable Bleeding minimal  Will send for venous duplex of LE in am.     Medication List         ibuprofen 800 MG tablet  Commonly known as:  ADVIL,MOTRIN  Take 1 tablet (800 mg total) by mouth every 8 (eight) hours as needed.     omeprazole 20 MG capsule  Commonly known as:  PRILOSEC  Take 20 mg by mouth daily.     prenatal  vitamin w/FE, FA 27-1 MG Tabs tablet  Take 1 tablet by mouth daily.       Follow-up Information   Follow up with [facility] In 2 weeks.   Specialty:  Obstetrics and Gynecology   Contact information:   [address] [place] [address] [phone]       Tawnya Crook 06/30/2013, 1:04 AM

## 2013-06-30 NOTE — MAU Note (Signed)
Had miscarriage 06/23/13. Today started having heavy bleeding, cramping, and passing large clots.

## 2013-07-23 ENCOUNTER — Encounter: Payer: Medicaid Other | Admitting: Nurse Practitioner

## 2014-02-06 ENCOUNTER — Inpatient Hospital Stay (HOSPITAL_COMMUNITY)
Admission: AD | Admit: 2014-02-06 | Discharge: 2014-02-07 | Disposition: A | Discharge status: Home / Self Care | Payer: Medicaid Other | Source: Ambulatory Visit | Attending: Obstetrics & Gynecology | Admitting: Obstetrics & Gynecology

## 2014-02-06 ENCOUNTER — Encounter (HOSPITAL_COMMUNITY): Payer: Self-pay | Admitting: *Deleted

## 2014-02-06 DIAGNOSIS — K219 Gastro-esophageal reflux disease without esophagitis: Secondary | ICD-10-CM | POA: Insufficient documentation

## 2014-02-06 DIAGNOSIS — O368190 Decreased fetal movements, unspecified trimester, not applicable or unspecified: Secondary | ICD-10-CM | POA: Insufficient documentation

## 2014-02-06 DIAGNOSIS — O368121 Decreased fetal movements, second trimester, fetus 1: Secondary | ICD-10-CM

## 2014-02-06 NOTE — MAU Note (Signed)
Pt presents no fetal movement in 24 hours.

## 2014-02-07 ENCOUNTER — Encounter (HOSPITAL_COMMUNITY): Payer: Self-pay | Admitting: *Deleted

## 2014-02-07 DIAGNOSIS — K219 Gastro-esophageal reflux disease without esophagitis: Secondary | ICD-10-CM | POA: Diagnosis not present

## 2014-02-07 DIAGNOSIS — O368190 Decreased fetal movements, unspecified trimester, not applicable or unspecified: Secondary | ICD-10-CM | POA: Diagnosis not present

## 2014-02-07 NOTE — MAU Note (Signed)
Pt states she has not felt fetal movements in the last 24 hours. Pt states she first felt fetal movements around 17 wks. Pt states  That it was early compared to first born which she felt movement around 21 wks

## 2014-02-07 NOTE — Discharge Instructions (Signed)

## 2014-02-07 NOTE — MAU Provider Note (Signed)
History     CSN: [id number]  Arrival date and time: 02/06/14 2335   None     Chief Complaint  Patient presents with  . Decreased Fetal Movement   HPI 33 year old G3P1001 at 4522 weeks gestation who presents with complaint of decreased fetal movement. Patient first noticed fetal movement several weeks ago that usually becomes more active when she eats. She has not noticed any movement at all in the last 24 hours and is concerned something may be wrong with the baby. She had a fetal ultrasound performed last week which she said looked good. She has no contractions, vaginal bleeding, gush of fluid or pain.    Past Medical History  Diagnosis Date  . GERD (gastroesophageal reflux disease)     Past Surgical History  Procedure Laterality Date  . Cesarean section    . Breast enhancement surgery  2001  . Hysteroscopy w/d&c  09/14/2011    Procedure: DILATATION AND CURETTAGE /HYSTEROSCOPY;  Surgeon: Waverly Ferrarianiel H. Christell ConstantMoore, MD;  Location: [facility];  Service: Gynecology;  Laterality: N/A;  . Iud removal  09/14/2011    Procedure: INTRAUTERINE DEVICE (IUD) REMOVAL;  Surgeon: Waverly Ferrarianiel H. Christell ConstantMoore, MD;  Location: [facility];  Service: Gynecology;  Laterality: N/A;    Family History  Problem Relation Age of Onset  . Diabetes Father   . Heart disease Father   . Stroke Paternal Grandmother     History  Substance Use Topics  . Smoking status: Never Smoker   . Smokeless tobacco: Never Used  . Alcohol Use: No    Allergies:  Allergies  Allergen Reactions  . Morphine And Related     "burns me up inside"    Prescriptions prior to admission  Medication Sig Dispense Refill  . ibuprofen (ADVIL,MOTRIN) 800 MG tablet Take 1 tablet (800 mg total) by mouth every 8 (eight) hours as needed.  30 tablet  0  . omeprazole (PRILOSEC) 20 MG capsule Take 20 mg by mouth daily.      . prenatal vitamin w/FE, FA (PRENATAL 1 + 1) 27-1 MG TABS tablet Take 1 tablet by mouth daily.  30 each  0    Review of Systems   Constitutional: Negative for fever and chills.  HENT: Negative for hearing loss.   Eyes: Negative for blurred vision and double vision.  Respiratory: Negative for cough and shortness of breath.   Cardiovascular: Negative for chest pain and palpitations.  Gastrointestinal: Negative for heartburn, nausea, vomiting, abdominal pain and diarrhea.  Genitourinary: Negative for dysuria and urgency.  Musculoskeletal: Negative for myalgias.  Skin: Negative for rash.  Neurological: Negative for dizziness, tingling and headaches.   Physical Exam   Blood pressure 116/90, pulse 86, resp. rate 20, height 5\' 3"  (1.6 m), weight 104.327 kg (230 lb), last menstrual period 05/15/2013, SpO2 100.00%, unknown if currently breastfeeding.  Physical Exam  Constitutional: She is oriented to person, place, and time. She appears well-developed and well-nourished.  Cardiovascular: Normal rate and regular rhythm.   Respiratory: Effort normal.  GI: Soft. Bowel sounds are normal.  Musculoskeletal: She exhibits no edema.  Neurological: She is alert and oriented to person, place, and time.    MAU Course  Procedures  MDM FHR measured with doppler and showed rate 140.  Assessment and Plan  33 year old G3P1001 at 22 weeks with decreased awareness of fetal movement. Baby has good heart rate on doppler. Discussed possible reasons why fetal movement is difficult to notice. Instructed her to start regular kick  counts after 26 weeks. No other concerns on history or physical to suggest problems with pregnancy. Followup with prenatal provider in 2 weeks.  Markus Jarvis A 02/07/2014, 1:01 AM   I have seen and examined this patient and I agree with the above. Cam Hai CNM 8:42 AM 02/07/2014

## 2014-02-12 NOTE — MAU Provider Note (Signed)
Attestation of Attending Supervision of Advanced Practitioner: Evaluation and management procedures were performed by the PA/NP/CNM/OB Fellow under my supervision/collaboration. Chart reviewed and agree with management and plan.  Kimberly Douglas V 02/12/2014 7:34 AM

## 2014-05-13 ENCOUNTER — Encounter (HOSPITAL_COMMUNITY): Payer: Self-pay | Admitting: *Deleted

## 2014-07-14 ENCOUNTER — Observation Stay (HOSPITAL_BASED_OUTPATIENT_CLINIC_OR_DEPARTMENT_OTHER)
Admission: EM | Admit: 2014-07-14 | Discharge: 2014-07-15 | Disposition: A | Discharge status: Home / Self Care | Payer: No Typology Code available for payment source | Attending: Emergency Medicine | Admitting: Emergency Medicine

## 2014-07-14 ENCOUNTER — Encounter (HOSPITAL_BASED_OUTPATIENT_CLINIC_OR_DEPARTMENT_OTHER): Payer: Self-pay | Admitting: *Deleted

## 2014-07-14 ENCOUNTER — Emergency Department (HOSPITAL_BASED_OUTPATIENT_CLINIC_OR_DEPARTMENT_OTHER): Payer: No Typology Code available for payment source

## 2014-07-14 DIAGNOSIS — Z885 Allergy status to narcotic agent status: Secondary | ICD-10-CM | POA: Diagnosis not present

## 2014-07-14 DIAGNOSIS — I214 Non-ST elevation (NSTEMI) myocardial infarction: Secondary | ICD-10-CM | POA: Diagnosis not present

## 2014-07-14 DIAGNOSIS — K219 Gastro-esophageal reflux disease without esophagitis: Secondary | ICD-10-CM | POA: Insufficient documentation

## 2014-07-14 DIAGNOSIS — Z79899 Other long term (current) drug therapy: Secondary | ICD-10-CM | POA: Diagnosis not present

## 2014-07-14 HISTORY — DX: Gestational (pregnancy-induced) hypertension without significant proteinuria, unspecified trimester: O13.9

## 2014-07-14 LAB — BASIC METABOLIC PANEL
Anion gap: 3 — ABNORMAL LOW (ref 5–15)
BUN: 11 mg/dL (ref 6–23)
CALCIUM: 8.1 mg/dL — AB (ref 8.4–10.5)
CO2: 28 mmol/L (ref 19–32)
Chloride: 107 mEq/L (ref 96–112)
Creatinine, Ser: 0.8 mg/dL (ref 0.50–1.10)
Glucose, Bld: 104 mg/dL — ABNORMAL HIGH (ref 70–99)
Potassium: 3.7 mmol/L (ref 3.5–5.1)
SODIUM: 138 mmol/L (ref 135–145)

## 2014-07-14 LAB — CBC WITH DIFFERENTIAL/PLATELET
Basophils Absolute: 0 10*3/uL (ref 0.0–0.1)
Basophils Relative: 0 % (ref 0–1)
EOS ABS: 0.1 10*3/uL (ref 0.0–0.7)
EOS PCT: 2 % (ref 0–5)
HCT: 29.9 % — ABNORMAL LOW (ref 36.0–46.0)
Hemoglobin: 9.2 g/dL — ABNORMAL LOW (ref 12.0–15.0)
Lymphocytes Relative: 37 % (ref 12–46)
Lymphs Abs: 2.7 10*3/uL (ref 0.7–4.0)
MCH: 27.2 pg (ref 26.0–34.0)
MCHC: 30.8 g/dL (ref 30.0–36.0)
MCV: 88.5 fL (ref 78.0–100.0)
Monocytes Absolute: 0.6 10*3/uL (ref 0.1–1.0)
Monocytes Relative: 8 % (ref 3–12)
NEUTROS PCT: 53 % (ref 43–77)
Neutro Abs: 3.9 10*3/uL (ref 1.7–7.7)
PLATELETS: 396 10*3/uL (ref 150–400)
RBC: 3.38 MIL/uL — ABNORMAL LOW (ref 3.87–5.11)
RDW: 14 % (ref 11.5–15.5)
WBC: 7.3 10*3/uL (ref 4.0–10.5)

## 2014-07-14 LAB — TROPONIN I: TROPONIN I: 0.04 ng/mL — AB (ref <0.031)

## 2014-07-14 MED ORDER — ASPIRIN 81 MG PO CHEW
324.0000 mg | CHEWABLE_TABLET | Freq: Once | ORAL | Status: AC
  Administered 2014-07-14: 324 mg via ORAL
  Filled 2014-07-14: qty 4

## 2014-07-14 MED ORDER — NITROGLYCERIN 0.4 MG SL SUBL
0.4000 mg | SUBLINGUAL_TABLET | SUBLINGUAL | Status: DC | PRN
  Administered 2014-07-14: 0.4 mg via SUBLINGUAL
  Filled 2014-07-14: qty 1

## 2014-07-14 MED ORDER — SODIUM CHLORIDE 0.9 % IV BOLUS (SEPSIS)
1000.0000 mL | Freq: Once | INTRAVENOUS | Status: AC
  Administered 2014-07-14: 1000 mL via INTRAVENOUS

## 2014-07-14 NOTE — ED Notes (Addendum)
C/o L chest pain, onset 2 hrs ago, had a dizzy spell (resolved), h/o gest.htn, delivered baby in November. (denies: cough, congestion, cold sx, fever, nvd, fever, sob, lasting dizziness, leg pain or other sx), no meds PTA. (non-smoker, no BC use). Pain was a 5/10, now it is an 8/10. Alert, NAD, calm, interactive, no dyspnea noted. No aggravating or aleviating factors. "does not feel like my GERD". LMP started today. "BP higher today than usual".

## 2014-07-14 NOTE — ED Provider Notes (Signed)
CSN: [id number]     Arrival date & time 07/14/14  2042 History  This chart was scribed for Rolan Bucco, MD by Milly Jakob, ED Scribe. The patient was seen in room MH07/MH07. Patient's care was started at 11:10 PM.   Chief Complaint  Patient presents with  . Chest Pain   The history is provided by the patient. No language interpreter was used.   HPI Comments: Kimberly Douglas is a 34 y.o. female who presents to the Emergency Department complaining of constant, left sided, "tight" chest pain which began 4 hours ago at rest.  She reports associated dizziness. She denies a history of heart problems. She states that her father had an MI at 73 and her brother had an MI as well. Her father and first cousin had stents placed, but her brother did not. She states that she was pre-hypertensive during pregnancy and that they did not put her on any medications. She states that her BP was non-hypertensive at her last checkup. She denies nausea and vomiting. She denies abdominal pain, SOB, and leg swelling. She recently stopped breast feeding. She states that she bled for two weeks after having her baby, and she was started on iron. She states that her HG usually runs low, in the 9's or 10's. She states that she recently had her first period since giving birth and it was normal.   Past Medical History  Diagnosis Date  . GERD (gastroesophageal reflux disease)   . Gestational hypertension    Past Surgical History  Procedure Laterality Date  . Cesarean section    . Breast enhancement surgery  2001  . Hysteroscopy w/d&c  09/14/2011    Procedure: DILATATION AND CURETTAGE /HYSTEROSCOPY;  Surgeon: Waverly Ferrari. Christell Constant, MD;  Location: [facility];  Service: Gynecology;  Laterality: N/A;  . Iud removal  09/14/2011    Procedure: INTRAUTERINE DEVICE (IUD) REMOVAL;  Surgeon: Waverly Ferrari. Christell Constant, MD;  Location: [facility];  Service: Gynecology;  Laterality: N/A;   Family History  Problem Relation Age of Onset  . Diabetes Father   .  Heart disease Father   . Stroke Paternal Grandmother    History  Substance Use Topics  . Smoking status: Never Smoker   . Smokeless tobacco: Never Used  . Alcohol Use: No   OB History    Gravida Para Term Preterm AB TAB SAB Ectopic Multiple Living   Review of Systems  Constitutional: Negative for fever, chills, diaphoresis and fatigue.  HENT: Negative for congestion, rhinorrhea and sneezing.   Eyes: Negative.   Respiratory: Negative for cough, chest tightness and shortness of breath.   Cardiovascular: Positive for chest pain. Negative for leg swelling.  Gastrointestinal: Negative for nausea, vomiting, abdominal pain, diarrhea and blood in stool.  Genitourinary: Negative for frequency, hematuria, flank pain and difficulty urinating.  Musculoskeletal: Negative for back pain and arthralgias.  Skin: Negative for rash.  Neurological: Positive for dizziness and light-headedness. Negative for speech difficulty, weakness, numbness and headaches.    Allergies  Morphine and related  Home Medications   Prior to Admission medications   Medication Sig Start Date End Date Taking? Authorizing Provider  ibuprofen (ADVIL,MOTRIN) 800 MG tablet Take 1 tablet (800 mg total) by mouth every 8 (eight) hours as needed. 06/30/13   Heather Alger Memos, CNM  omeprazole (PRILOSEC) 20 MG capsule Take 20 mg by mouth daily.    Historical Provider, MD  prenatal  vitamin w/FE, FA (PRENATAL 1 + 1) 27-1 MG TABS tablet Take 1 tablet by mouth daily. 06/22/13   Danae Orleans, CNM   Triage Vitals: BP 141/76 mmHg  Pulse 71  Temp(Src) 98.9 F (37.2 C)  Resp 18  Ht 5\' 2"  (1.575 m)  Wt 212 lb (96.163 kg)  BMI 38.77 kg/m2  SpO2 99%  LMP 07/14/2014  Breastfeeding? Unknown Physical Exam  Constitutional: She is oriented to person, place, and time. She appears well-developed and well-nourished.  HENT:  Head: Normocephalic and atraumatic.  Eyes: Pupils are equal, round, and reactive to light.   Neck: Normal range of motion. Neck supple.  Cardiovascular: Normal rate, regular rhythm and normal heart sounds.   Pulmonary/Chest: Effort normal and breath sounds normal. No respiratory distress. She has no wheezes. She has no rales. She exhibits no tenderness.  Abdominal: Soft. Bowel sounds are normal. There is no tenderness. There is no rebound and no guarding.  Musculoskeletal: Normal range of motion. She exhibits no edema.  No calf tenderness  Lymphadenopathy:    She has no cervical adenopathy.  Neurological: She is alert and oriented to person, place, and time.  Skin: Skin is warm and dry. No rash noted.  Psychiatric: She has a normal mood and affect.    ED Course  Procedures (including critical care time) DIAGNOSTIC STUDIES: Oxygen Saturation is 99% on room air, normal by my interpretation.    COORDINATION OF CARE: 11:14 PM-Discussed treatment plan which includes CXR, lab work, EKG, aspirin, sodium chloride, and nitroglycerin with pt at bedside and pt agreed to plan.   Labs Review Labs Reviewed  CBC WITH DIFFERENTIAL - Abnormal; Notable for the following:    RBC 3.38 (*)    Hemoglobin 9.2 (*)    HCT 29.9 (*)    All other components within normal limits  BASIC METABOLIC PANEL - Abnormal; Notable for the following:    Glucose, Bld 104 (*)    Calcium 8.1 (*)    Anion gap 3 (*)    All other components within normal limits  TROPONIN I - Abnormal; Notable for the following:    Troponin I 0.04 (*)    All other components within normal limits  TROPONIN I    Imaging Review Dg Chest 2 View  07/14/2014   CLINICAL DATA:  Left chest pain, onset 2 hr ago. Dizzy spell now resolved. Delivered baby in November.  EXAM: CHEST  2 VIEW  COMPARISON:  06/03/2013  FINDINGS: The heart size and mediastinal contours are within normal limits. Both lungs are clear. The visualized skeletal structures are unremarkable.  IMPRESSION: No active cardiopulmonary disease.   Electronically Signed   By:  Burman Nieves M.D.   On: 07/14/2014 23:16     EKG Interpretation   Date/Time:  Sunday July 14 2014 23:27:03 EST Ventricular Rate:  75 PR Interval:  158 QRS Duration: 82 QT Interval:  410 QTC Calculation: 457 R Axis:   17 Text Interpretation:  Normal sinus rhythm Possible Left atrial enlargement  Cannot rule out Anterior infarct , age undetermined Abnormal ECG since  last tracing no significant change Confirmed by Banks Chaikin  MD, Sophiamarie Nease ([id number])  on 07/14/2014 11:43:55 PM      MDM   Final diagnoses:  NSTEMI (non-ST elevated myocardial infarction)    Pt with a slightly positive troponin. She has no ischemic changes on EKG. She was given aspirin and nitroglycerin pain-free after this. I spoke with Dr. Tresa Endo, the cardiology fellow who advised the patient  to be admitted to the hospitalist service. Given her slightly elevated troponin and strong family history, I felt that she needs to be admitted for further cardiac evaluation. I spoke with the hospitalist on-call who has accepted the patient for transfer to [facility].  After the patient had except in stool Jason Nest, she advised that she did not want to be admitted to the hospital. I spent a long time discussing with her the risk of leaving including undiagnosed coronary artery disease which could result in sudden cardiac death. She ultimately then decided to be admitted however after about another 30-45 minutes, she decided that she was going to leave again. I talked to her and her family member who is with her and she is adamant about not wanting to be admitted to the hospital. She understands the risk of leaving and says that she's to follow-up with her primary care physician. She was advised to return if she changes her mind about being admitted her symptoms worsen. She left AMA. I personally performed the services described in this documentation, which was scribed in my presence.  The recorded information has been reviewed and  considered.    Rolan Bucco, MD 07/15/14 [phone]

## 2014-07-15 DIAGNOSIS — I214 Non-ST elevation (NSTEMI) myocardial infarction: Secondary | ICD-10-CM | POA: Diagnosis present

## 2014-07-15 LAB — TROPONIN I: Troponin I: <0.03 ng/mL (ref <0.031)

## 2014-07-15 MED ORDER — HEPARIN BOLUS VIA INFUSION: 4000.0000 [IU] | Freq: Once | INTRAVENOUS | Status: DC

## 2014-07-15 NOTE — ED Notes (Signed)
In room to talk to patient, pt crying, upset, stating she just wants to go home to her children, patient requesting to sign out AMA - risk/benefits explained per EDP.

## 2015-06-24 ENCOUNTER — Emergency Department (HOSPITAL_BASED_OUTPATIENT_CLINIC_OR_DEPARTMENT_OTHER)
Admission: EM | Admit: 2015-06-24 | Discharge: 2015-06-24 | Disposition: A | Discharge status: Home / Self Care | Payer: No Typology Code available for payment source | Attending: Emergency Medicine | Admitting: Emergency Medicine

## 2015-06-24 ENCOUNTER — Encounter (HOSPITAL_BASED_OUTPATIENT_CLINIC_OR_DEPARTMENT_OTHER): Payer: Self-pay

## 2015-06-24 DIAGNOSIS — R5383 Other fatigue: Secondary | ICD-10-CM | POA: Insufficient documentation

## 2015-06-24 DIAGNOSIS — K529 Noninfective gastroenteritis and colitis, unspecified: Secondary | ICD-10-CM | POA: Insufficient documentation

## 2015-06-24 DIAGNOSIS — Z3202 Encounter for pregnancy test, result negative: Secondary | ICD-10-CM | POA: Insufficient documentation

## 2015-06-24 LAB — COMPREHENSIVE METABOLIC PANEL
ALBUMIN: 3.1 g/dL — AB (ref 3.5–5.0)
ALK PHOS: 66 U/L (ref 38–126)
ALT: 13 U/L — AB (ref 14–54)
ANION GAP: 6 (ref 5–15)
AST: 15 U/L (ref 15–41)
BUN: 8 mg/dL (ref 6–20)
CALCIUM: 8.4 mg/dL — AB (ref 8.9–10.3)
CO2: 26 mmol/L (ref 22–32)
Chloride: 106 mmol/L (ref 101–111)
Creatinine, Ser: 0.85 mg/dL (ref 0.44–1.00)
GFR calc non Af Amer: >60 mL/min (ref >60)
Glucose, Bld: 105 mg/dL — ABNORMAL HIGH (ref 65–99)
Potassium: 3.7 mmol/L (ref 3.5–5.1)
SODIUM: 138 mmol/L (ref 135–145)
Total Bilirubin: 0.4 mg/dL (ref 0.3–1.2)
Total Protein: 7 g/dL (ref 6.5–8.1)

## 2015-06-24 LAB — URINALYSIS, ROUTINE W REFLEX MICROSCOPIC
BILIRUBIN URINE: NEGATIVE
Glucose, UA: NEGATIVE mg/dL
KETONES UR: NEGATIVE mg/dL
LEUKOCYTES UA: NEGATIVE
Nitrite: NEGATIVE
PROTEIN: NEGATIVE mg/dL
Specific Gravity, Urine: 1.026 (ref 1.005–1.030)
pH: 5.5 (ref 5.0–8.0)

## 2015-06-24 LAB — LIPASE, BLOOD: Lipase: 24 U/L (ref 11–51)

## 2015-06-24 LAB — URINE MICROSCOPIC-ADD ON: WBC UA: NONE SEEN WBC/hpf (ref 0–5)

## 2015-06-24 LAB — CBC WITH DIFFERENTIAL/PLATELET
BASOS PCT: 0 %
Basophils Absolute: 0 10*3/uL (ref 0.0–0.1)
EOS PCT: 1 %
Eosinophils Absolute: 0.1 10*3/uL (ref 0.0–0.7)
HCT: 35.4 % — ABNORMAL LOW (ref 36.0–46.0)
HEMOGLOBIN: 11 g/dL — AB (ref 12.0–15.0)
Lymphocytes Relative: 18 %
Lymphs Abs: 1.3 10*3/uL (ref 0.7–4.0)
MCH: 26.8 pg (ref 26.0–34.0)
MCHC: 31.1 g/dL (ref 30.0–36.0)
MCV: 86.3 fL (ref 78.0–100.0)
Monocytes Absolute: 0.9 10*3/uL (ref 0.1–1.0)
Monocytes Relative: 11 %
NEUTROS PCT: 70 %
Neutro Abs: 5.2 10*3/uL (ref 1.7–7.7)
Platelets: 356 10*3/uL (ref 150–400)
RBC: 4.1 MIL/uL (ref 3.87–5.11)
RDW: 14.6 % (ref 11.5–15.5)
WBC: 7.5 10*3/uL (ref 4.0–10.5)

## 2015-06-24 LAB — PREGNANCY, URINE: Preg Test, Ur: NEGATIVE

## 2015-06-24 MED ORDER — DICYCLOMINE HCL 20 MG PO TABS: 20.0000 mg | ORAL_TABLET | Freq: Two times a day (BID) | ORAL | Status: DC

## 2015-06-24 MED ORDER — ONDANSETRON 4 MG PO TBDP: 4.0000 mg | ORAL_TABLET | Freq: Three times a day (TID) | ORAL | Status: DC | PRN

## 2015-06-24 MED ORDER — SODIUM CHLORIDE 0.9 % IV BOLUS (SEPSIS)
1000.0000 mL | Freq: Once | INTRAVENOUS | Status: AC
  Administered 2015-06-24: 1000 mL via INTRAVENOUS

## 2015-06-24 MED ORDER — GI COCKTAIL ~~LOC~~
30.0000 mL | Freq: Once | ORAL | Status: AC
  Administered 2015-06-24: 30 mL via ORAL
  Filled 2015-06-24: qty 30

## 2015-06-24 MED ORDER — ONDANSETRON HCL 4 MG/2ML IJ SOLN
4.0000 mg | Freq: Once | INTRAMUSCULAR | Status: AC
Start: 2015-06-24 — End: 2015-06-24
  Administered 2015-06-24: 4 mg via INTRAVENOUS
  Filled 2015-06-24: qty 2

## 2015-06-24 MED ORDER — DICYCLOMINE HCL 10 MG PO CAPS
20.0000 mg | ORAL_CAPSULE | Freq: Once | ORAL | Status: AC
  Administered 2015-06-24: 20 mg via ORAL
  Filled 2015-06-24: qty 2

## 2015-06-24 NOTE — ED Provider Notes (Signed)
CSN: [id number]     Arrival date & time 06/24/15  1546 History   First MD Initiated Contact with Patient 06/24/15 1607     Chief Complaint  Patient presents with  . Emesis     (Consider location/radiation/quality/duration/timing/severity/associated sxs/prior Treatment) HPI Comments: Started off with diarrhea, then vomiting started and abdominal pain, now feelign weak all over Started Sunday night Diarrhea 6x Vomiting 3x Couldn't keep anything down today Appetite ok, but unable to keep down Fever 100.0 last night No urinary symptoms Upper abd pain, radiating down, sharp pains, constant, was coming and going, unknown if worse with eating, lying down helps, no hx of pain like this Son had a cold, no diarrhea No hx of recent abx, recent travel,   Patient is a 34 y.o. female presenting with vomiting.  Emesis Associated symptoms: abdominal pain and diarrhea   Associated symptoms: no headaches and no sore throat     Past Medical History  Diagnosis Date  . GERD (gastroesophageal reflux disease)   . Gestational hypertension    Past Surgical History  Procedure Laterality Date  . Cesarean section    . Breast enhancement surgery  2001  . Hysteroscopy w/d&c  09/14/2011    Procedure: DILATATION AND CURETTAGE /HYSTEROSCOPY;  Surgeon: Waverly Ferrarianiel H. Christell ConstantMoore, MD;  Location: [facility];  Service: Gynecology;  Laterality: N/A;  . Iud removal  09/14/2011    Procedure: INTRAUTERINE DEVICE (IUD) REMOVAL;  Surgeon: Waverly Ferrarianiel H. Christell ConstantMoore, MD;  Location: [facility];  Service: Gynecology;  Laterality: N/A;   Family History  Problem Relation Age of Onset  . Diabetes Father   . Heart disease Father   . Stroke Paternal Grandmother    Social History  Substance Use Topics  . Smoking status: Never Smoker   . Smokeless tobacco: Never Used  . Alcohol Use: No   OB History    Gravida Para Term Preterm AB TAB SAB Ectopic Multiple Living   3 1 1       1      Review of Systems  Constitutional: Positive for fever (100.0  at home) and fatigue. Negative for appetite change.  HENT: Negative for sore throat.   Eyes: Negative for visual disturbance.  Respiratory: Negative for cough and shortness of breath.   Cardiovascular: Negative for chest pain.  Gastrointestinal: Positive for nausea, vomiting, abdominal pain and diarrhea.  Genitourinary: Negative for difficulty urinating.  Musculoskeletal: Negative for back pain and neck pain.  Skin: Negative for rash.  Neurological: Negative for syncope and headaches.      Allergies  Morphine and related  Home Medications   Prior to Admission medications   Medication Sig Start Date End Date Taking? Authorizing Provider  dicyclomine (BENTYL) 20 MG tablet Take 1 tablet (20 mg total) by mouth 2 (two) times daily. 06/24/15   Alvira MondayErin Mea Ozga, MD  ondansetron (ZOFRAN ODT) 4 MG disintegrating tablet Take 1 tablet (4 mg total) by mouth every 8 (eight) hours as needed for nausea or vomiting. 06/24/15   Alvira MondayErin Juliano Mceachin, MD   BP 125/79 mmHg  Pulse 94  Temp(Src) 98.5 F (36.9 C) (Oral)  Resp 18  Ht 5\' 2"  (1.575 m)  Wt 250 lb (113.399 kg)  BMI 45.71 kg/m2  SpO2 100%  LMP 05/28/2015 Physical Exam  Constitutional: She is oriented to person, place, and time. She appears well-developed and well-nourished. No distress.  HENT:  Head: Normocephalic and atraumatic.  Eyes: Conjunctivae and EOM are normal.  Neck: Normal range of motion.  Cardiovascular: Normal rate, regular  rhythm, normal heart sounds and intact distal pulses.  Exam reveals no gallop and no friction rub.   No murmur heard. Pulmonary/Chest: Effort normal and breath sounds normal. No respiratory distress. She has no wheezes. She has no rales.  Abdominal: Soft. She exhibits no distension. There is tenderness (diffuse, mildly worse in RLQ, suprapubic). There is no guarding and negative Murphy's sign.  Musculoskeletal: She exhibits no edema or tenderness.  Neurological: She is alert and oriented to person, place,  and time.  Skin: Skin is warm and dry. No rash noted. She is not diaphoretic. No erythema.  Nursing note and vitals reviewed.   ED Course  Procedures (including critical care time) Labs Review Labs Reviewed  URINALYSIS, ROUTINE W REFLEX MICROSCOPIC (NOT AT [facility]) - Abnormal; Notable for the following:    APPearance CLOUDY (*)    Hgb urine dipstick SMALL (*)    All other components within normal limits  CBC WITH DIFFERENTIAL/PLATELET - Abnormal; Notable for the following:    Hemoglobin 11.0 (*)    HCT 35.4 (*)    All other components within normal limits  COMPREHENSIVE METABOLIC PANEL - Abnormal; Notable for the following:    Glucose, Bld 105 (*)    Calcium 8.4 (*)    Albumin 3.1 (*)    ALT 13 (*)    All other components within normal limits  URINE MICROSCOPIC-ADD ON - Abnormal; Notable for the following:    Squamous Epithelial / LPF 6-30 (*)    Bacteria, UA RARE (*)    All other components within normal limits  PREGNANCY, URINE  LIPASE, BLOOD    Imaging Review No results found. I have personally reviewed and evaluated these images and lab results as part of my medical decision-making.   EKG Interpretation None      MDM   Final diagnoses:  Gastroenteritis   34yo female with no significant medical history presents with 3 days of nausea, vomiting and diarrhea. Pt also reports abdominal pain--urinalysis shows no sign of infection, lipase/transaminases WNL  Negative Murphy's sign.  Discussed with pt low clinical suspicion for appendicitis given normal appetite, no leukocytosis, no fever here, significant diarrhea, overall benign exam and likely gastroenteritis. Pt with some tenderness suprapubic and RLQ on exam, however not significant and after discussion with pt, will forego imaging and will provide symptomatic treatment for more likely diagnosis of gastroenteritis. Discussed return precautions with pt in detail.  Pt improved with IVF and zofran. Will dc with zofran, bentyl  rx. Patient discharged in stable condition with understanding of reasons to return.    Alvira Monday, MD 06/25/15 1150

## 2015-06-24 NOTE — ED Notes (Signed)
Rx x 2 given for zofran and bentyl. D/c home with ride

## 2015-06-24 NOTE — ED Notes (Signed)
N/v/d x 3 days

## 2015-06-27 IMAGING — CR DG CHEST 2V
2 series · 2 of 2 positions shown · non-contrast
Comparison: 07/08/2012

CLINICAL DATA: Chest pain, abdominal pain.

EXAM:
CHEST  2 VIEW

[w chest pa]
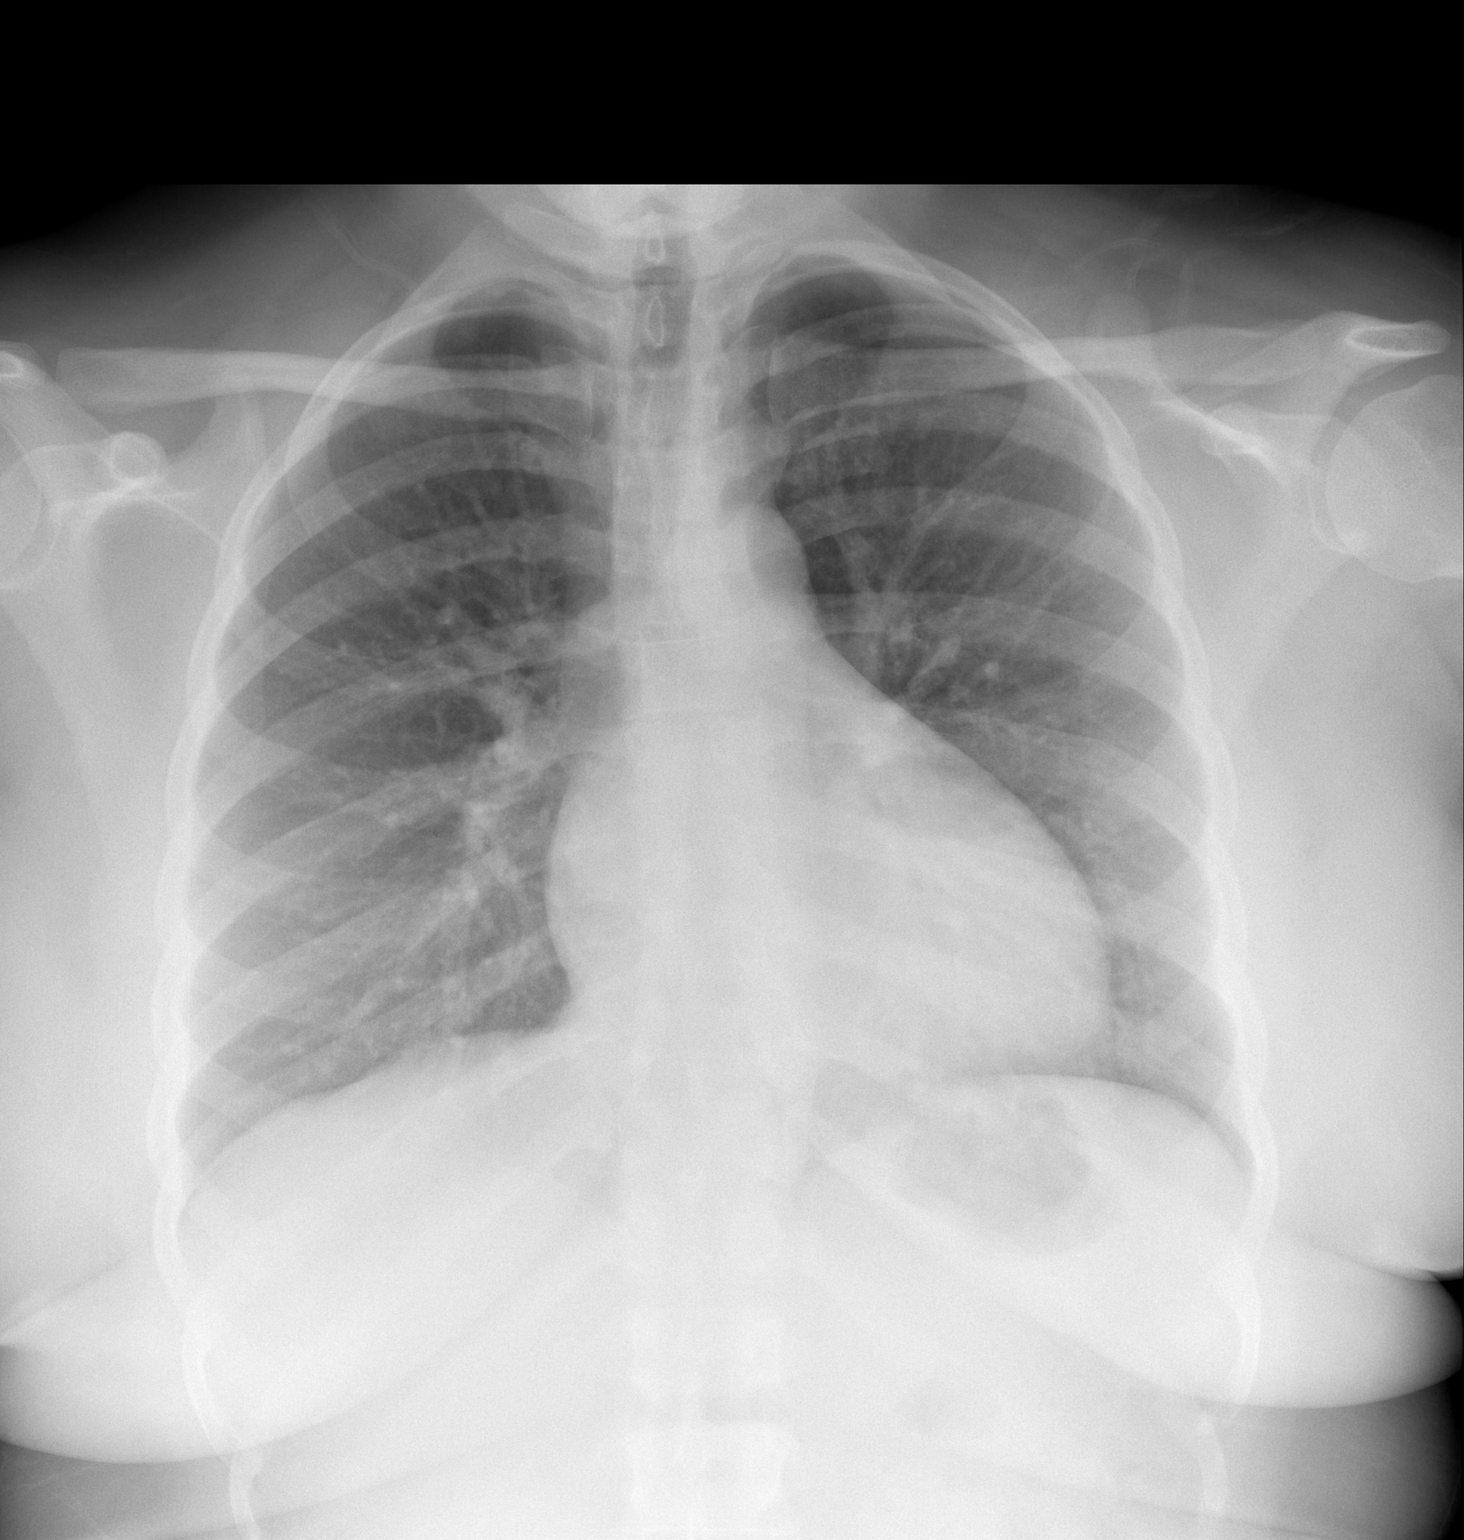

[w chest lat]
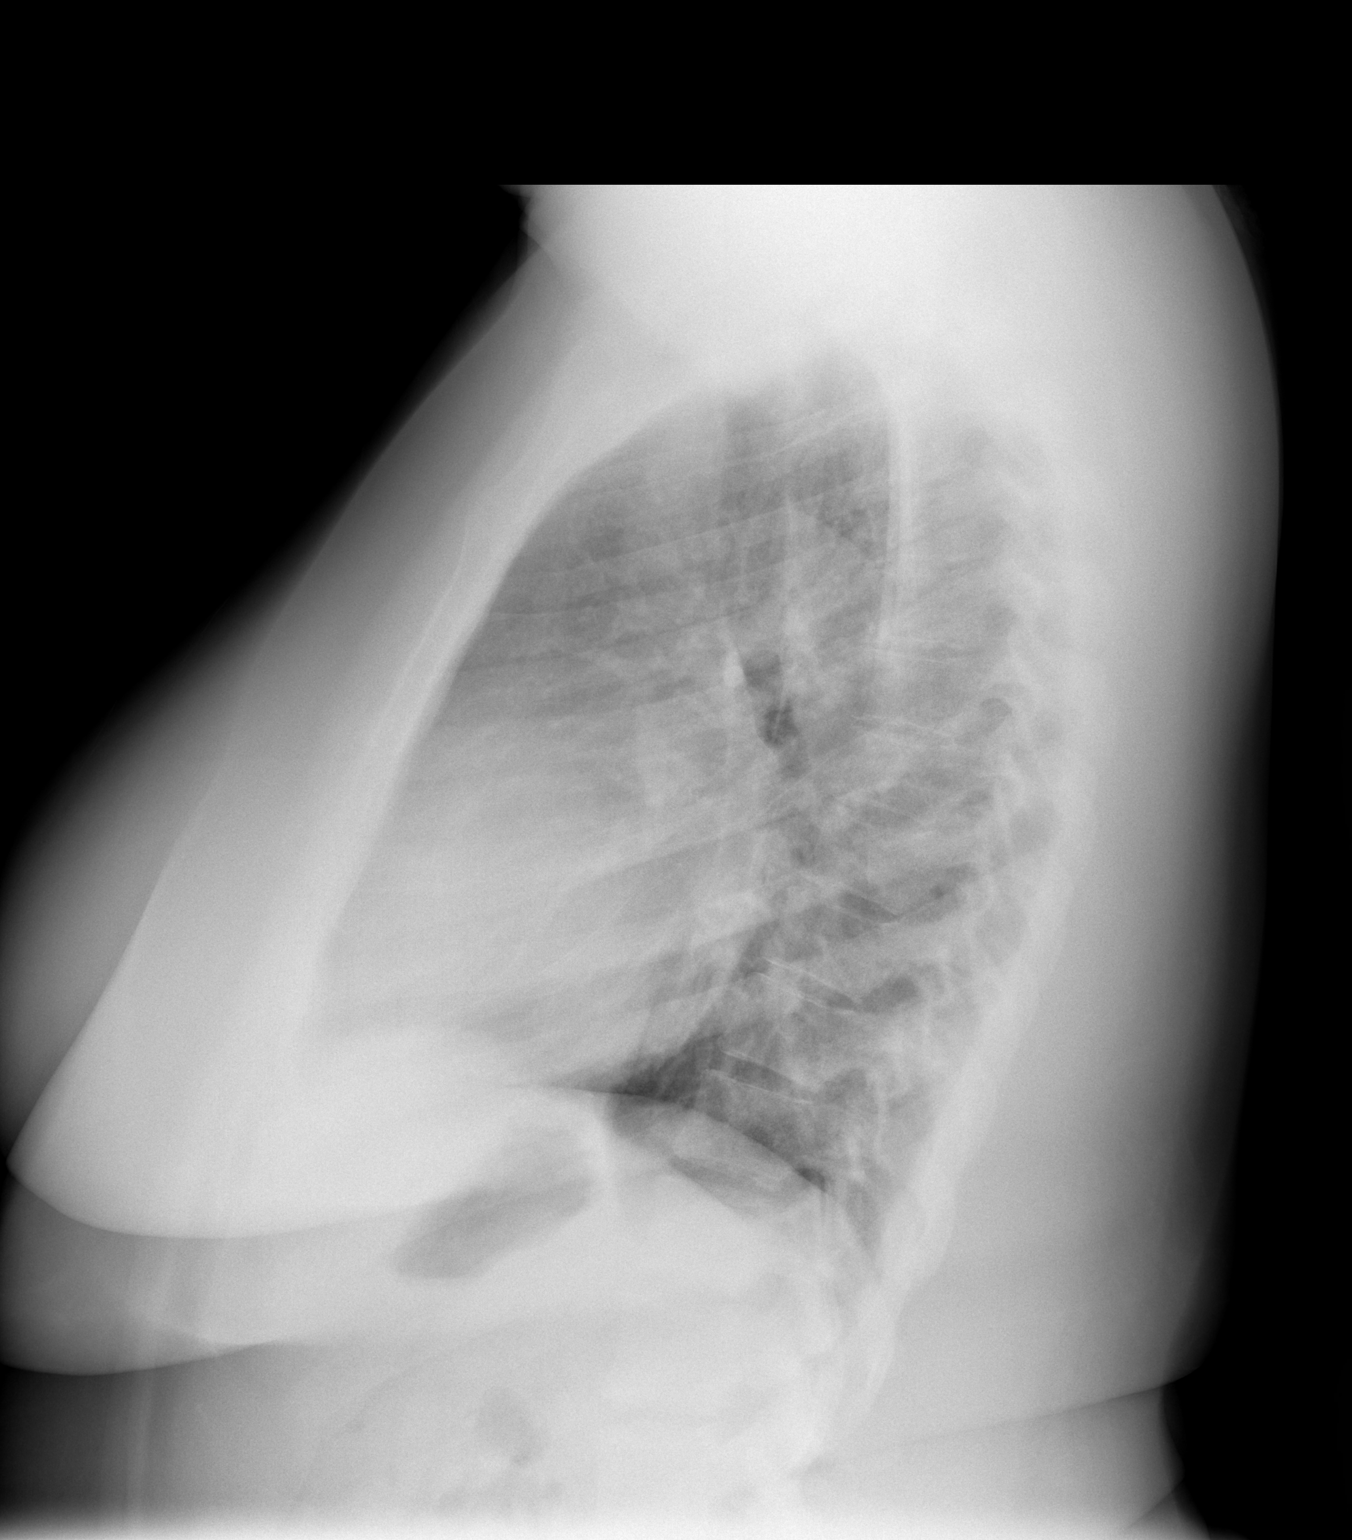

[2 of 2 positions shown; findings below may reference images not displayed]

FINDINGS: The heart size and mediastinal contours are within normal limits.
Both lungs are clear. The visualized skeletal structures are
unremarkable.
IMPRESSION: No active cardiopulmonary disease.

## 2015-09-02 ENCOUNTER — Inpatient Hospital Stay (HOSPITAL_COMMUNITY)
Admission: AD | Admit: 2015-09-02 | Discharge: 2015-09-02 | Disposition: A | Discharge status: Home / Self Care | Payer: Medicaid Other | Source: Ambulatory Visit | Attending: Family Medicine | Admitting: Family Medicine

## 2015-09-02 ENCOUNTER — Encounter (HOSPITAL_COMMUNITY): Payer: Self-pay

## 2015-09-02 DIAGNOSIS — K219 Gastro-esophageal reflux disease without esophagitis: Secondary | ICD-10-CM | POA: Insufficient documentation

## 2015-09-02 DIAGNOSIS — O26891 Other specified pregnancy related conditions, first trimester: Secondary | ICD-10-CM | POA: Diagnosis not present

## 2015-09-02 DIAGNOSIS — Z3A01 Less than 8 weeks gestation of pregnancy: Secondary | ICD-10-CM | POA: Insufficient documentation

## 2015-09-02 DIAGNOSIS — R519 Headache, unspecified: Secondary | ICD-10-CM

## 2015-09-02 DIAGNOSIS — H53149 Visual discomfort, unspecified: Secondary | ICD-10-CM | POA: Insufficient documentation

## 2015-09-02 DIAGNOSIS — G43009 Migraine without aura, not intractable, without status migrainosus: Secondary | ICD-10-CM

## 2015-09-02 DIAGNOSIS — H539 Unspecified visual disturbance: Secondary | ICD-10-CM

## 2015-09-02 DIAGNOSIS — G43909 Migraine, unspecified, not intractable, without status migrainosus: Secondary | ICD-10-CM | POA: Diagnosis not present

## 2015-09-02 DIAGNOSIS — R42 Dizziness and giddiness: Secondary | ICD-10-CM | POA: Diagnosis present

## 2015-09-02 DIAGNOSIS — R51 Headache: Secondary | ICD-10-CM | POA: Diagnosis not present

## 2015-09-02 HISTORY — DX: Anemia, unspecified: D64.9

## 2015-09-02 LAB — CBC
HEMATOCRIT: 35.2 % — AB (ref 36.0–46.0)
Hemoglobin: 11.3 g/dL — ABNORMAL LOW (ref 12.0–15.0)
MCH: 27.1 pg (ref 26.0–34.0)
MCHC: 32.1 g/dL (ref 30.0–36.0)
MCV: 84.4 fL (ref 78.0–100.0)
PLATELETS: 433 10*3/uL — AB (ref 150–400)
RBC: 4.17 MIL/uL (ref 3.87–5.11)
RDW: 14.5 % (ref 11.5–15.5)
WBC: 7.5 10*3/uL (ref 4.0–10.5)

## 2015-09-02 LAB — COMPREHENSIVE METABOLIC PANEL
ALT: 18 U/L (ref 14–54)
ANION GAP: 9 (ref 5–15)
AST: 23 U/L (ref 15–41)
Albumin: 3.2 g/dL — ABNORMAL LOW (ref 3.5–5.0)
Alkaline Phosphatase: 69 U/L (ref 38–126)
BILIRUBIN TOTAL: 0.4 mg/dL (ref 0.3–1.2)
BUN: 10 mg/dL (ref 6–20)
CO2: 25 mmol/L (ref 22–32)
Calcium: 8.5 mg/dL — ABNORMAL LOW (ref 8.9–10.3)
Chloride: 106 mmol/L (ref 101–111)
Creatinine, Ser: 0.82 mg/dL (ref 0.44–1.00)
GFR calc Af Amer: >60 mL/min (ref >60)
Glucose, Bld: 87 mg/dL (ref 65–99)
POTASSIUM: 4.2 mmol/L (ref 3.5–5.1)
Sodium: 140 mmol/L (ref 135–145)
TOTAL PROTEIN: 7 g/dL (ref 6.5–8.1)

## 2015-09-02 LAB — URINALYSIS, ROUTINE W REFLEX MICROSCOPIC
Bilirubin Urine: NEGATIVE
Glucose, UA: NEGATIVE mg/dL
Ketones, ur: 15 mg/dL — AB
Leukocytes, UA: NEGATIVE
NITRITE: NEGATIVE
PROTEIN: NEGATIVE mg/dL
Specific Gravity, Urine: 1.025 (ref 1.005–1.030)
pH: 5.5 (ref 5.0–8.0)

## 2015-09-02 LAB — POCT PREGNANCY, URINE: PREG TEST UR: POSITIVE — AB

## 2015-09-02 LAB — URINE MICROSCOPIC-ADD ON

## 2015-09-02 MED ORDER — DEXAMETHASONE SODIUM PHOSPHATE 10 MG/ML IJ SOLN
10.0000 mg | Freq: Once | INTRAMUSCULAR | Status: AC
  Administered 2015-09-02: 10 mg via INTRAVENOUS
  Filled 2015-09-02: qty 1

## 2015-09-02 MED ORDER — DIPHENHYDRAMINE HCL 50 MG/ML IJ SOLN
25.0000 mg | Freq: Once | INTRAMUSCULAR | Status: AC
  Administered 2015-09-02: 25 mg via INTRAVENOUS
  Filled 2015-09-02: qty 1

## 2015-09-02 MED ORDER — METOCLOPRAMIDE HCL 5 MG/ML IJ SOLN
10.0000 mg | Freq: Once | INTRAMUSCULAR | Status: AC
  Administered 2015-09-02: 10 mg via INTRAVENOUS
  Filled 2015-09-02: qty 2

## 2015-09-02 MED ORDER — LACTATED RINGERS IV BOLUS (SEPSIS)
1000.0000 mL | Freq: Once | INTRAVENOUS | Status: AC
  Administered 2015-09-02: 1000 mL via INTRAVENOUS

## 2015-09-02 NOTE — Discharge Instructions (Signed)
Dizziness Dizziness is a common problem. It is a feeling of unsteadiness or light-headedness. You may feel like you are about to faint. Dizziness can lead to injury if you stumble or fall. Anyone can become dizzy, but dizziness is more common in older adults. This condition can be caused by a number of things, including medicines, dehydration, or illness. HOME CARE INSTRUCTIONS Taking these steps may help with your condition: Eating and Drinking  Drink enough fluid to keep your urine clear or pale yellow. This helps to keep you from becoming dehydrated. Try to drink more clear fluids, such as water.  Do not drink alcohol.  Limit your caffeine intake if directed by your health care provider.  Limit your salt intake if directed by your health care provider. Activity  Avoid making quick movements.  Rise slowly from chairs and steady yourself until you feel okay.  In the morning, first sit up on the side of the bed. When you feel okay, stand slowly while you hold onto something until you know that your balance is fine.  Move your legs often if you need to stand in one place for a long time. Tighten and relax your muscles in your legs while you are standing.  Do not drive or operate heavy machinery if you feel dizzy.  Avoid bending down if you feel dizzy. Place items in your home so that they are easy for you to reach without leaning over. Lifestyle  Do not use any tobacco products, including cigarettes, chewing tobacco, or electronic cigarettes. If you need help quitting, ask your health care provider.  Try to reduce your stress level, such as with yoga or meditation. Talk with your health care provider if you need help. General Instructions  Watch your dizziness for any changes.  Take medicines only as directed by your health care provider. Talk with your health care provider if you think that your dizziness is caused by a medicine that you are taking.  Tell a friend or a family  member that you are feeling dizzy. If he or she notices any changes in your behavior, have this person call your health care provider.  Keep all follow-up visits as directed by your health care provider. This is important. SEEK MEDICAL CARE IF:  Your dizziness does not go away.  Your dizziness or light-headedness gets worse.  You feel nauseous.  You have reduced hearing.  You have new symptoms.  You are unsteady on your feet or you feel like the room is spinning. SEEK IMMEDIATE MEDICAL CARE IF:  You vomit or have diarrhea and are unable to eat or drink anything.  You have problems talking, walking, swallowing, or using your arms, hands, or legs.  You feel generally weak.  You are not thinking clearly or you have trouble forming sentences. It may take a friend or family member to notice this.  You have chest pain, abdominal pain, shortness of breath, or sweating.  Your vision changes.  You notice any bleeding.  You have a headache.  You have neck pain or a stiff neck.  You have a fever.   This information is not intended to replace advice given to you by your health care provider. Make sure you discuss any questions you have with your health care provider.   Document Released: 12/22/2000 Document Revised: 11/12/2014 Document Reviewed: 06/24/2014 Elsevier Interactive Patient Education 2016 [employer]. Migraine Headache A migraine headache is an intense, throbbing pain on one or both sides of your head. A  migraine can last for 30 minutes to several hours. CAUSES  The exact cause of a migraine headache is not always known. However, a migraine may be caused when nerves in the brain become irritated and release chemicals that cause inflammation. This causes pain. Certain things may also trigger migraines, such as:  Alcohol.  Smoking.  Stress.  Menstruation.  Aged cheeses.  Foods or drinks that contain nitrates, glutamate, aspartame, or tyramine.  Lack of  sleep.  Chocolate.  Caffeine.  Hunger.  Physical exertion.  Fatigue.  Medicines used to treat chest pain (nitroglycerine), birth control pills, estrogen, and some blood pressure medicines. SIGNS AND SYMPTOMS  Pain on one or both sides of your head.  Pulsating or throbbing pain.  Severe pain that prevents daily activities.  Pain that is aggravated by any physical activity.  Nausea, vomiting, or both.  Dizziness.  Pain with exposure to bright lights, loud noises, or activity.  General sensitivity to bright lights, loud noises, or smells. Before you get a migraine, you may get warning signs that a migraine is coming (aura). An aura may include:  Seeing flashing lights.  Seeing bright spots, halos, or zigzag lines.  Having tunnel vision or blurred vision.  Having feelings of numbness or tingling.  Having trouble talking.  Having muscle weakness. DIAGNOSIS  A migraine headache is often diagnosed based on:  Symptoms.  Physical exam.  A CT scan or MRI of your head. These imaging tests cannot diagnose migraines, but they can help rule out other causes of headaches. TREATMENT Medicines may be given for pain and nausea. Medicines can also be given to help prevent recurrent migraines.  HOME CARE INSTRUCTIONS  Only take over-the-counter or prescription medicines for pain or discomfort as directed by your health care provider. The use of long-term narcotics is not recommended.  Lie down in a dark, quiet room when you have a migraine.  Keep a journal to find out what may trigger your migraine headaches. For example, write down:  What you eat and drink.  How much sleep you get.  Any change to your diet or medicines.  Limit alcohol consumption.  Quit smoking if you smoke.  Get 7-9 hours of sleep, or as recommended by your health care provider.  Limit stress.  Keep lights dim if bright lights bother you and make your migraines worse. SEEK IMMEDIATE MEDICAL  CARE IF:   Your migraine becomes severe.  You have a fever.  You have a stiff neck.  You have vision loss.  You have muscular weakness or loss of muscle control.  You start losing your balance or have trouble walking.  You feel faint or pass out.  You have severe symptoms that are different from your first symptoms. MAKE SURE YOU:   Understand these instructions.  Will watch your condition.  Will get help right away if you are not doing well or get worse.   This information is not intended to replace advice given to you by your health care provider. Make sure you discuss any questions you have with your health care provider.   Document Released: 06/28/2005 Document Revised: 07/19/2014 Document Reviewed: 03/05/2013 Elsevier Interactive Patient Education [employer]

## 2015-09-02 NOTE — MAU Note (Signed)
Urine in lab 

## 2015-09-02 NOTE — MAU Provider Note (Signed)
Chief Complaint: Dizziness   First Provider Initiated Contact with Patient 09/02/15 1718      SUBJECTIVE HPI: Kimberly Douglas is a 35 y.o. [id number] at [redacted]w[redacted]d by LMP who presents to maternity admissions reporting dizziness starting at 10 am while pt was at work this morning, associated with vision changes, and onset of headache with photophobia this afternoon.  She described her vision changes as seeing white spots when standing up that resolved with rest.  She reports she has not had much fluid to drink today and may be dehydrated.  She denies n/v.  She has not taken any medications for her symptoms or tried any treatments.  She was high risk pregnancy with HTN in her previous pregnancy and was worried about her blood pressure.  She plans to start care with OB/Gyn in [facility]. She denies abdominal pain, vaginal bleeding, vaginal itching/burning, urinary symptoms, n/v, or fever/chills.     HPI  Past Medical History  Diagnosis Date  . GERD (gastroesophageal reflux disease)   . Gestational hypertension   . Anemia    Past Surgical History  Procedure Laterality Date  . Cesarean section    . Breast enhancement surgery  2001  . Hysteroscopy w/d&c  09/14/2011    Procedure: DILATATION AND CURETTAGE /HYSTEROSCOPY;  Surgeon: Waverly Ferrari. Christell Constant, MD;  Location: [facility];  Service: Gynecology;  Laterality: N/A;  . Iud removal  09/14/2011    Procedure: INTRAUTERINE DEVICE (IUD) REMOVAL;  Surgeon: Waverly Ferrari. Christell Constant, MD;  Location: [facility];  Service: Gynecology;  Laterality: N/A;   Social History   Social History  . Marital Status: Married    Spouse Name: N/A  . Number of Children: N/A  . Years of Education: N/A   Occupational History  . Not on file.   Social History Main Topics  . Smoking status: Never Smoker   . Smokeless tobacco: Never Used  . Alcohol Use: No  . Drug Use: No  . Sexual Activity: Yes    Birth Control/ Protection: None   Other Topics Concern  . Not on file   Social History Narrative    Current Facility-Administered Medications on File Prior to Encounter  Medication Dose Route Frequency Provider Last Rate Last Dose  . doxycycline (VIBRAMYCIN) 200 mg in dextrose 5 % 250 mL IVPB  200 mg Intravenous 120 min pre-op Delbert Harness, MD       Current Outpatient Prescriptions on File Prior to Encounter  Medication Sig Dispense Refill  . dicyclomine (BENTYL) 20 MG tablet Take 1 tablet (20 mg total) by mouth 2 (two) times daily. (Patient not taking: Reported on 09/02/2015) 20 tablet 0  . ondansetron (ZOFRAN ODT) 4 MG disintegrating tablet Take 1 tablet (4 mg total) by mouth every 8 (eight) hours as needed for nausea or vomiting. (Patient not taking: Reported on 09/02/2015) 20 tablet 0   Allergies  Allergen Reactions  . Morphine And Related     "burns me up inside"    ROS:  Review of Systems  Constitutional: Negative for fever, chills and fatigue.  HENT: Negative for tinnitus.   Eyes: Positive for photophobia and visual disturbance.  Respiratory: Negative for shortness of breath.   Cardiovascular: Negative for chest pain.  Genitourinary: Negative for dysuria, flank pain, vaginal bleeding, vaginal discharge, difficulty urinating, vaginal pain and pelvic pain.  Neurological: Positive for dizziness and light-headedness. Negative for headaches.  Psychiatric/Behavioral: Negative.      I have reviewed patient's Past Medical Hx, Surgical Hx, Family Hx, Social  Hx, medications and allergies.   Physical Exam   Patient Vitals for the past 24 hrs:  BP Temp Temp src Pulse Resp  09/02/15 1625 122/81 mmHg 98.2 F (36.8 C) Oral 92 18   Constitutional: Well-developed, well-nourished female in no acute distress.  HEART: normal rate, heart sounds, regular rhythm RESP: normal effort, lung sounds clear and equal bilaterally GI: Abd soft, non-tender. Pos BS x 4 MS: Extremities nontender, no edema, normal ROM Neurological - alert, oriented, normal speech, no focal findings or movement  disorder noted, screening mental status exam normal, cranial nerves II through XII intact, DTR's normal and symmetric, motor and sensory grossly normal bilaterally, normal muscle tone, no tremors, strength 5/5 GU: Neg CVAT.   LAB RESULTS Results for orders placed or performed during the hospital encounter of 09/02/15 (from the past 24 hour(s))  Urinalysis, Routine w reflex microscopic (not at [facility])     Status: Abnormal   Collection Time: 09/02/15  4:30 PM  Result Value Ref Range   Color, Urine YELLOW YELLOW   APPearance HAZY (A) CLEAR   Specific Gravity, Urine 1.025 1.005 - 1.030   pH 5.5 5.0 - 8.0   Glucose, UA NEGATIVE NEGATIVE mg/dL   Hgb urine dipstick TRACE (A) NEGATIVE   Bilirubin Urine NEGATIVE NEGATIVE   Ketones, ur 15 (A) NEGATIVE mg/dL   Protein, ur NEGATIVE NEGATIVE mg/dL   Nitrite NEGATIVE NEGATIVE   Leukocytes, UA NEGATIVE NEGATIVE  Urine microscopic-add on     Status: Abnormal   Collection Time: 09/02/15  4:30 PM  Result Value Ref Range   Squamous Epithelial / LPF 0-5 (A) NONE SEEN   WBC, UA 0-5 0 - 5 WBC/hpf   RBC / HPF 0-5 0 - 5 RBC/hpf   Bacteria, UA MANY (A) NONE SEEN  Pregnancy, urine POC     Status: Abnormal   Collection Time: 09/02/15  4:38 PM  Result Value Ref Range   Preg Test, Ur POSITIVE (A) NEGATIVE  CBC     Status: Abnormal   Collection Time: 09/02/15  5:44 PM  Result Value Ref Range   WBC 7.5 4.0 - 10.5 K/uL   RBC 4.17 3.87 - 5.11 MIL/uL   Hemoglobin 11.3 (L) 12.0 - 15.0 g/dL   HCT 78.2 (L) 95.6 - 21.3 %   MCV 84.4 78.0 - 100.0 fL   MCH 27.1 26.0 - 34.0 pg   MCHC 32.1 30.0 - 36.0 g/dL   RDW 08.6 57.8 - 46.9 %   Platelets 433 (H) 150 - 400 K/uL  Comprehensive metabolic panel     Status: Abnormal   Collection Time: 09/02/15  5:44 PM  Result Value Ref Range   Sodium 140 135 - 145 mmol/L   Potassium 4.2 3.5 - 5.1 mmol/L   Chloride 106 101 - 111 mmol/L   CO2 25 22 - 32 mmol/L   Glucose, Bld 87 65 - 99 mg/dL   BUN 10 6 - 20 mg/dL   Creatinine,  Ser 6.29 0.44 - 1.00 mg/dL   Calcium 8.5 (L) 8.9 - 10.3 mg/dL   Total Protein 7.0 6.5 - 8.1 g/dL   Albumin 3.2 (L) 3.5 - 5.0 g/dL   AST 23 15 - 41 U/L   ALT 18 14 - 54 U/L   Alkaline Phosphatase 69 38 - 126 U/L   Total Bilirubin 0.4 0.3 - 1.2 mg/dL   GFR calc non Af Amer >60 >60 mL/min   GFR calc Af Amer >60 >60 mL/min   Anion gap  9 5 - 15       IMAGING No results found.  MAU Management/MDM: Ordered labs and reviewed results.  Pt mildly dehydrated with no other lab abnormalities. Dehydration likely cause of dizziness and dehydration may be trigger for migraine as well.  Treatments in MAU included migraine cocktail with LR x 1000 ml, Reglan 10 mg IV, Benadryl 25 mg IV, and Decadron 10 mg IV.  Pt reports complete resolution of h/a, dizziness, and photophobia in MAU.  Neuro exam wnl.  Pt stable at time of discharge.  ASSESSMENT 1. Dizziness   2. Nonintractable migraine, unspecified migraine type   3. Headache in pregnancy, antepartum, first trimester   4. Visual disturbance     PLAN Discharge home Increase PO fluids/food    Medication List    STOP taking these medications        dicyclomine 20 MG tablet  Commonly known as:  BENTYL     ibuprofen 800 MG tablet  Commonly known as:  ADVIL,MOTRIN     ondansetron 4 MG disintegrating tablet  Commonly known as:  ZOFRAN ODT      TAKE these medications        prenatal multivitamin Tabs tablet  Take 1 tablet by mouth daily at 12 noon.       Follow-up Information    Please follow up.   Why:  With your OB/Gyn provider as scheduled      Sharen Counter Certified Nurse-Midwife 09/02/2015  6:51 PM

## 2015-09-02 NOTE — MAU Note (Signed)
Pt C/O dizziness & seeing spots since this morning.  Has hx of gestational hypertension with last pregnancy, is concerned.  Pos HPT on 2/12.  Denies abd pain or bleeding.

## 2015-09-04 ENCOUNTER — Encounter (HOSPITAL_COMMUNITY): Payer: Self-pay | Admitting: *Deleted

## 2015-09-04 ENCOUNTER — Inpatient Hospital Stay (HOSPITAL_COMMUNITY): Payer: Medicaid Other

## 2015-09-04 ENCOUNTER — Inpatient Hospital Stay (HOSPITAL_COMMUNITY)
Admission: AD | Admit: 2015-09-04 | Discharge: 2015-09-04 | Disposition: A | Discharge status: Home / Self Care | Payer: Medicaid Other | Source: Ambulatory Visit | Attending: Obstetrics & Gynecology | Admitting: Obstetrics & Gynecology

## 2015-09-04 DIAGNOSIS — O3680X0 Pregnancy with inconclusive fetal viability, not applicable or unspecified: Secondary | ICD-10-CM

## 2015-09-04 DIAGNOSIS — O200 Threatened abortion: Secondary | ICD-10-CM | POA: Diagnosis not present

## 2015-09-04 DIAGNOSIS — Z3A01 Less than 8 weeks gestation of pregnancy: Secondary | ICD-10-CM | POA: Diagnosis not present

## 2015-09-04 DIAGNOSIS — O209 Hemorrhage in early pregnancy, unspecified: Secondary | ICD-10-CM | POA: Diagnosis present

## 2015-09-04 LAB — RPR: RPR Ser Ql: NONREACTIVE

## 2015-09-04 LAB — CBC
HEMATOCRIT: 33.2 % — AB (ref 36.0–46.0)
HEMOGLOBIN: 10.7 g/dL — AB (ref 12.0–15.0)
MCH: 27.2 pg (ref 26.0–34.0)
MCHC: 32.2 g/dL (ref 30.0–36.0)
MCV: 84.5 fL (ref 78.0–100.0)
Platelets: 404 10*3/uL — ABNORMAL HIGH (ref 150–400)
RBC: 3.93 MIL/uL (ref 3.87–5.11)
RDW: 14.5 % (ref 11.5–15.5)
WBC: 8.9 10*3/uL (ref 4.0–10.5)

## 2015-09-04 LAB — WET PREP, GENITAL
CLUE CELLS WET PREP: NONE SEEN
SPERM: NONE SEEN
TRICH WET PREP: NONE SEEN
YEAST WET PREP: NONE SEEN

## 2015-09-04 LAB — HCG, QUANTITATIVE, PREGNANCY: HCG, BETA CHAIN, QUANT, S: 342 m[IU]/mL — AB (ref <5)

## 2015-09-04 LAB — HIV ANTIBODY (ROUTINE TESTING W REFLEX): HIV Screen 4th Generation wRfx: NONREACTIVE

## 2015-09-04 NOTE — MAU Provider Note (Signed)
History     CSN: [id number]  Arrival date and time: 09/04/15 4098   First Provider Initiated Contact with Patient 09/04/15 [phone]      Chief Complaint  Patient presents with  . Vaginal Bleeding   HPI   Ms.Kimberly Douglas is a 35 y.o. female [phone] at [redacted]w[redacted]d presenting to MAU with vaginal bleeding. She has had constipation for the last few days and had a hard BM this morning and thought she saw blood in her stool. Initially she thought it was coming from her rectum but then realized it was vaginal bleeding. She has a history of SAB X 3. She is very anxious at this time.   She denies pain at this time.  The Bleeding is very light; bright red at times with occasional small clots.   OB History    Gravida Para Term Preterm AB TAB SAB Ectopic Multiple Living   6 2 2  3  3   2       Past Medical History  Diagnosis Date  . GERD (gastroesophageal reflux disease)   . Gestational hypertension   . Anemia     Past Surgical History  Procedure Laterality Date  . Cesarean section    . Breast enhancement surgery  2001  . Hysteroscopy w/d&c  09/14/2011    Procedure: DILATATION AND CURETTAGE /HYSTEROSCOPY;  Surgeon: Waverly Ferrari. Christell Constant, MD;  Location: [facility];  Service: Gynecology;  Laterality: N/A;  . Iud removal  09/14/2011    Procedure: INTRAUTERINE DEVICE (IUD) REMOVAL;  Surgeon: Waverly Ferrari. Christell Constant, MD;  Location: [facility];  Service: Gynecology;  Laterality: N/A;    Family History  Problem Relation Age of Onset  . Diabetes Father   . Heart disease Father   . Stroke Paternal Grandmother     Social History  Substance Use Topics  . Smoking status: Never Smoker   . Smokeless tobacco: Never Used  . Alcohol Use: No    Allergies:  Allergies  Allergen Reactions  . Morphine And Related     "burns me up inside"    Prescriptions prior to admission  Medication Sig Dispense Refill Last Dose  . Prenatal Vit-Fe Fumarate-FA (PRENATAL MULTIVITAMIN) TABS tablet Take 1 tablet by mouth daily at 12 noon.    09/04/2015 at Unknown time   Results for orders placed or performed during the hospital encounter of 09/04/15 (from the past 48 hour(s))  CBC     Status: Abnormal   Collection Time: 09/04/15  8:05 AM  Result Value Ref Range   WBC 8.9 4.0 - 10.5 K/uL   RBC 3.93 3.87 - 5.11 MIL/uL   Hemoglobin 10.7 (L) 12.0 - 15.0 g/dL   HCT 21.3 (L) 08.6 - 57.8 %   MCV 84.5 78.0 - 100.0 fL   MCH 27.2 26.0 - 34.0 pg   MCHC 32.2 30.0 - 36.0 g/dL   RDW 46.9 62.9 - 52.8 %   Platelets 404 (H) 150 - 400 K/uL  hCG, quantitative, pregnancy     Status: Abnormal   Collection Time: 09/04/15  8:05 AM  Result Value Ref Range   hCG, Beta Chain, Quant, S 342 (H) <5 mIU/mL    Comment:          GEST. AGE      CONC.  (mIU/mL)   <=1 WEEK        5 - 50     2 WEEKS       50 - 500     3 WEEKS  100 - 10,000     4 WEEKS     1,000 - 30,000     5 WEEKS     3,500 - 115,000   6-8 WEEKS     12,000 - 270,000    12 WEEKS     15,000 - 220,000        FEMALE AND NON-PREGNANT FEMALE:     LESS THAN 5 mIU/mL   Wet prep, genital     Status: Abnormal   Collection Time: 09/04/15  9:15 AM  Result Value Ref Range   Yeast Wet Prep HPF POC NONE SEEN NONE SEEN   Trich, Wet Prep NONE SEEN NONE SEEN   Clue Cells Wet Prep HPF POC NONE SEEN NONE SEEN   WBC, Wet Prep HPF POC FEW (A) NONE SEEN    Comment: MODERATE BACTERIA SEEN   Sperm NONE SEEN    US Ob Comp Less 14 Wks  09/04/2015  CLINICAL DATA:  Vaginal bleeding during first trimester pregnancy. Gestational age by LMP of 5 weeks 4 days. EXAM: OBSTETRIC <14 WK [place] AND TRANSVAGINAL OB US TECHNIQUE: Both transabdominal and transvaginal ultrasound examinations were performed for complete evaluation of the gestation as well as the maternal uterus, adnexal regions, and pelvic cul-de-sac. Transvaginal technique was performed to assess early pregnancy. COMPARISON:  None. FINDINGS: No intrauterine gestational sac or other fluid collection visualized in endometrial cavity. A small calcified  uterine fibroid is seen in the right lateral corpus measuring 1.6 cm. Both ovaries are normal in appearance. No adnexal mass or free fluid identified. IMPRESSION: Pregnancy location not visualized sonographically. Differential diagnosis includes recent spontaneous abortion, IUP too early to visualize, and non-visualized ectopic pregnancy. Recommend close follow up of quantitative B-HCG levels, and follow up US as clinically warranted. 1.6 cm calcified uterine fibroid. Electronically Signed   By: Myles Rosenthal M.D.   On: 09/04/2015 08:55   US Ob Transvaginal  09/04/2015  CLINICAL DATA:  Vaginal bleeding during first trimester pregnancy. Gestational age by LMP of 5 weeks 4 days. EXAM: OBSTETRIC <14 WK [place] AND TRANSVAGINAL OB US TECHNIQUE: Both transabdominal and transvaginal ultrasound examinations were performed for complete evaluation of the gestation as well as the maternal uterus, adnexal regions, and pelvic cul-de-sac. Transvaginal technique was performed to assess early pregnancy. COMPARISON:  None. FINDINGS: No intrauterine gestational sac or other fluid collection visualized in endometrial cavity. A small calcified uterine fibroid is seen in the right lateral corpus measuring 1.6 cm. Both ovaries are normal in appearance. No adnexal mass or free fluid identified. IMPRESSION: Pregnancy location not visualized sonographically. Differential diagnosis includes recent spontaneous abortion, IUP too early to visualize, and non-visualized ectopic pregnancy. Recommend close follow up of quantitative B-HCG levels, and follow up US as clinically warranted. 1.6 cm calcified uterine fibroid. Electronically Signed   By: Myles Rosenthal M.D.   On: 09/04/2015 08:55    Review of Systems  Constitutional: Negative for fever.  Gastrointestinal: Negative for nausea, vomiting and abdominal pain.  Genitourinary: Negative for dysuria.   Physical Exam   Blood pressure 141/91, pulse 79, temperature 98.3 F (36.8 C), temperature  source Oral, resp. rate 18, height  (1.575 m), weight 248 lb 3.2 oz (112.583 kg), last menstrual period 07/27/2015, unknown if currently breastfeeding.  Physical Exam  Constitutional: She is oriented to person, place, and time. She appears well-developed and well-nourished. No distress.  HENT:  Head: Normocephalic.  Eyes: Pupils are equal, round, and reactive to light.  Neck: Neck supple.  GI:  Soft. She exhibits no distension and no mass. There is no tenderness. There is no rebound and no guarding.  Genitourinary:  Speculum exam: Vagina - Small amount of dark red blood, no odor. Small clot noted; stringy.  Cervix - + small amount of active, dark red blood from os.  Bimanual exam: Cervix closed Uterus non tender, normal size Adnexa non tender, no masses bilaterally GC/Chlam, wet prep done Chaperone present for exam.  Musculoskeletal: Normal range of motion.  Neurological: She is alert and oriented to person, place, and time.  Skin: Skin is warm. She is not diaphoretic.  Psychiatric: Her behavior is normal.    MAU Course  Procedures  None  MDM  A positive blood type.   Assessment and Plan   A:  1. Threatened miscarriage   2. Vaginal bleeding in pregnancy, first trimester   3. Pregnancy of unknown anatomic location     P:  Discharge home in stable condition Bleeding precautions Return on Saturday for repeat Hcg level (48 hours) Pelvic rest Return to MAU if symptoms worsen  Support given.     Duane Lope, NP 09/04/2015 12:19 PM

## 2015-09-04 NOTE — MAU Note (Signed)
Noted vag bleeding after straining with hard stool. Small amt of bright red bleeding. No pain. Hx of SAB, very anxious

## 2015-09-04 NOTE — Discharge Instructions (Signed)

## 2015-09-05 LAB — GC/CHLAMYDIA PROBE AMP (~~LOC~~) NOT AT ARMC
CHLAMYDIA, DNA PROBE: NEGATIVE
NEISSERIA GONORRHEA: NEGATIVE

## 2015-09-06 ENCOUNTER — Inpatient Hospital Stay (HOSPITAL_COMMUNITY)
Admission: AD | Admit: 2015-09-06 | Discharge: 2015-09-06 | Disposition: A | Discharge status: Home / Self Care | Payer: Medicaid Other | Source: Ambulatory Visit | Attending: Obstetrics & Gynecology | Admitting: Obstetrics & Gynecology

## 2015-09-06 DIAGNOSIS — O039 Complete or unspecified spontaneous abortion without complication: Secondary | ICD-10-CM | POA: Diagnosis not present

## 2015-09-06 DIAGNOSIS — Z3A01 Less than 8 weeks gestation of pregnancy: Secondary | ICD-10-CM | POA: Diagnosis not present

## 2015-09-06 LAB — HCG, QUANTITATIVE, PREGNANCY: hCG, Beta Chain, Quant, S: 139 m[IU]/mL — ABNORMAL HIGH (ref <5)

## 2015-09-06 NOTE — MAU Provider Note (Signed)
History    First Provider Initiated Contact with Patient 09/06/15 1016     Chief Complaint:  Follow-up   Kimberly Douglas is  35 y.o. [id number] Patient's last menstrual period was 07/27/2015.Marland Kitchen Patient is here for follow up of quantitative HCG and ongoing surveillance of pregnancy status.   She is [redacted]w[redacted]d weeks gestation  by LMP.    Since her last visit, the patient is without new complaint.     ROS Abdomin Pain: None Vaginal bleeding: none now.   Passage of clots or tissue: None Dizziness: Non  Her previous Quantitative HCG values are:  Results for VERNELLE, WISNER (MRN [id number]) as of 09/06/2015 10:13  Ref. Range 09/04/2015 08:05  HCG, Beta Chain, Quant, S Latest Ref Range: <5 mIU/mL 342 (H)    Physical Exam   BP 131/83 mmHg  Pulse 85  Temp(Src) 98.3 F (36.8 C) (Oral)  Resp 18  LMP 07/27/2015 Constitutional: Well-nourished female in no apparent distress. No pallor Neuro: Alert and oriented 4 Cardiovascular: Normal rate Respiratory: Normal effort and rate Abdomen: Soft, nontender Gynecological Exam: examination not indicated  Labs: Results for orders placed or performed during the hospital encounter of 09/06/15 (from the past 24 hour(s))  hCG, quantitative, pregnancy   Collection Time: 09/06/15  9:20 AM  Result Value Ref Range   hCG, Beta Chain, Quant, S 139 (H) <5 mIU/mL    Ultrasound Studies:   US Ob Comp Less 14 Wks  09/04/2015  CLINICAL DATA:  Vaginal bleeding during first trimester pregnancy. Gestational age by LMP of 5 weeks 4 days. EXAM: OBSTETRIC <14 WK [place] AND TRANSVAGINAL OB US TECHNIQUE: Both transabdominal and transvaginal ultrasound examinations were performed for complete evaluation of the gestation as well as the maternal uterus, adnexal regions, and pelvic cul-de-sac. Transvaginal technique was performed to assess early pregnancy. COMPARISON:  None. FINDINGS: No intrauterine gestational sac or other fluid collection visualized in endometrial cavity. A small  calcified uterine fibroid is seen in the right lateral corpus measuring 1.6 cm. Both ovaries are normal in appearance. No adnexal mass or free fluid identified. IMPRESSION: Pregnancy location not visualized sonographically. Differential diagnosis includes recent spontaneous abortion, IUP too early to visualize, and non-visualized ectopic pregnancy. Recommend close follow up of quantitative B-HCG levels, and follow up US as clinically warranted. 1.6 cm calcified uterine fibroid. Electronically Signed   By: Myles Rosenthal M.D.   On: 09/04/2015 08:55   US Ob Transvaginal  09/04/2015  CLINICAL DATA:  Vaginal bleeding during first trimester pregnancy. Gestational age by LMP of 5 weeks 4 days. EXAM: OBSTETRIC <14 WK [place] AND TRANSVAGINAL OB US TECHNIQUE: Both transabdominal and transvaginal ultrasound examinations were performed for complete evaluation of the gestation as well as the maternal uterus, adnexal regions, and pelvic cul-de-sac. Transvaginal technique was performed to assess early pregnancy. COMPARISON:  None. FINDINGS: No intrauterine gestational sac or other fluid collection visualized in endometrial cavity. A small calcified uterine fibroid is seen in the right lateral corpus measuring 1.6 cm. Both ovaries are normal in appearance. No adnexal mass or free fluid identified. IMPRESSION: Pregnancy location not visualized sonographically. Differential diagnosis includes recent spontaneous abortion, IUP too early to visualize, and non-visualized ectopic pregnancy. Recommend close follow up of quantitative B-HCG levels, and follow up US as clinically warranted. 1.6 cm calcified uterine fibroid. Electronically Signed   By: Myles Rosenthal M.D.   On: 09/04/2015 08:55    MAU course/MDM: Quantitative hCG ordered  Pain and bleeding in early pregnancy with drop in Quant, but hemodynamically  stable.  Assessment: [redacted]w[redacted]d weeks gestation drop in Quant consistent with failed pregnancy although IUP not verified. 1.  Spontaneous miscarriage     Plan: Discharge home in stable condition. SAB precautions Support given.     Follow-up Information    Follow up with [facility].   Specialty:  Obstetrics and Gynecology   Why:  Weekly until pregnancy hormone level less than 1.   Contact information:   [address] [place] [address] [phone]      Follow up with [facility] [place] MATERN[facility] ADMISSIONS.   Why:  As needed in emergencies (severe pain, severe bleeding, fever greater than 100.4.)   Contact information:   [address] [id number] [place] [address] [phone]       Medication List    ASK your doctor about these medications        prenatal multivitamin Tabs tablet  Take 1 tablet by mouth daily at 12 noon.        Dorathy Kinsman, CNM 09/06/2015, 10:16 AM  2/3

## 2015-09-06 NOTE — MAU Note (Signed)
Feeling good.  Still feels preg.  No further bleeding or spotting.  No spotting.

## 2015-09-06 NOTE — Discharge Instructions (Signed)
Miscarriage  A miscarriage is the sudden loss of an unborn baby (fetus) before the 20th week of pregnancy. Most miscarriages happen in the first 3 months of pregnancy. Sometimes, it happens before a woman even knows she is pregnant. A miscarriage is also called a "spontaneous miscarriage" or "early pregnancy loss." Having a miscarriage can be an emotional experience. Talk with your caregiver about any questions you may have about miscarrying, the grieving process, and your future pregnancy plans.  CAUSES    Problems with the fetal chromosomes that make it impossible for the baby to develop normally. Problems with the baby's genes or chromosomes are most often the result of errors that occur, by chance, as the embryo divides and grows. The problems are not inherited from the parents.   Infection of the cervix or uterus.    Hormone problems.    Problems with the cervix, such as having an incompetent cervix. This is when the tissue in the cervix is not strong enough to hold the pregnancy.    Problems with the uterus, such as an abnormally shaped uterus, uterine fibroids, or congenital abnormalities.    Certain medical conditions.    Smoking, drinking alcohol, or taking illegal drugs.    Trauma.   Often, the cause of a miscarriage is unknown.   SYMPTOMS    Vaginal bleeding or spotting, with or without cramps or pain.   Pain or cramping in the abdomen or lower back.   Passing fluid, tissue, or blood clots from the vagina.  DIAGNOSIS   Your caregiver will perform a physical exam. You may also have an ultrasound to confirm the miscarriage. Blood or urine tests may also be ordered.  TREATMENT    Sometimes, treatment is not necessary if you naturally pass all the fetal tissue that was in the uterus. If some of the fetus or placenta remains in the body (incomplete miscarriage), tissue left behind may become infected and must be removed. Usually, a dilation and curettage (D and C) procedure is performed.  During a D and C procedure, the cervix is widened (dilated) and any remaining fetal or placental tissue is gently removed from the uterus.   Antibiotic medicines are prescribed if there is an infection. Other medicines may be given to reduce the size of the uterus (contract) if there is a lot of bleeding.   If you have Rh negative blood and your baby was Rh positive, you will need a Rh immunoglobulin shot. This shot will protect any future baby from having Rh blood problems in future pregnancies.  HOME CARE INSTRUCTIONS    Your caregiver may order bed rest or may allow you to continue light activity. Resume activity as directed by your caregiver.   Have someone help with home and family responsibilities during this time.    Keep track of the number of sanitary pads you use each day and how soaked (saturated) they are. Write down this information.    Do not use tampons. Do not douche or have sexual intercourse until approved by your caregiver.    Only take over-the-counter or prescription medicines for pain or discomfort as directed by your caregiver.    Do not take aspirin. Aspirin can cause bleeding.    Keep all follow-up appointments with your caregiver.    If you or your partner have problems with grieving, talk to your caregiver or seek counseling to help cope with the pregnancy loss. Allow enough time to grieve before trying to get pregnant again.     SEEK IMMEDIATE MEDICAL CARE IF:    You have severe cramps or pain in your back or abdomen.   You have a fever.   You pass large blood clots (walnut-sized or larger) ortissue from your vagina. Save any tissue for your caregiver to inspect.    Your bleeding increases.    You have a thick, bad-smelling vaginal discharge.   You become lightheaded, weak, or you faint.    You have chills.   MAKE SURE YOU:   Understand these instructions.   Will watch your condition.   Will get help right away if you are not doing well or get worse.     This  information is not intended to replace advice given to you by your health care provider. Make sure you discuss any questions you have with your health care provider.     Document Released: 12/22/2000 Document Revised: 10/23/2012 Document Reviewed: 08/17/2011  Elsevier Interactive Patient Education 2016 [employer]

## 2015-09-12 ENCOUNTER — Telehealth: Payer: Self-pay | Admitting: General Practice

## 2015-09-12 ENCOUNTER — Ambulatory Visit: Payer: No Typology Code available for payment source | Admitting: Obstetrics and Gynecology

## 2015-09-12 NOTE — Telephone Encounter (Signed)
Patient no showed for follow up bhcg. Called patient and discussed missed appt with her. Patient states she can come Monday at 745. Patient had no questions

## 2015-09-15 ENCOUNTER — Other Ambulatory Visit: Payer: No Typology Code available for payment source

## 2015-09-15 DIAGNOSIS — O3680X0 Pregnancy with inconclusive fetal viability, not applicable or unspecified: Secondary | ICD-10-CM

## 2015-09-15 LAB — HCG, QUANTITATIVE, PREGNANCY: HCG, BETA CHAIN, QUANT, S: 3.3 m[IU]/mL

## 2015-09-17 ENCOUNTER — Telehealth: Payer: Self-pay | Admitting: *Deleted

## 2015-09-17 NOTE — Telephone Encounter (Signed)
Tyonna left a voicemail message 09/16/15 afternoon requesting results of lab from 09/15/15.   Per chart had miscarriage and had bhcg drawn 09/15/15 which was 3.3.  Called Nava and left a message we received her call and were calling back, Please call clinic again and if we may leave detailed information on voicemail, please leave us that message.

## 2015-09-18 NOTE — Telephone Encounter (Signed)
Called patient back and informed her of b-hcg results. Understanding voiced. 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 What can be cone to prevent this in the future, is something wrong with her etc. I suggested that if she needed to she could come in for a general gyn exam and could discuss her concerns with a provider at that time. She stated that she would think on it and let us know.

## 2015-09-30 ENCOUNTER — Encounter (HOSPITAL_BASED_OUTPATIENT_CLINIC_OR_DEPARTMENT_OTHER): Payer: Self-pay | Admitting: Emergency Medicine

## 2015-09-30 ENCOUNTER — Emergency Department (HOSPITAL_BASED_OUTPATIENT_CLINIC_OR_DEPARTMENT_OTHER): Payer: Medicaid Other

## 2015-09-30 DIAGNOSIS — H6692 Otitis media, unspecified, left ear: Secondary | ICD-10-CM | POA: Diagnosis not present

## 2015-09-30 DIAGNOSIS — J111 Influenza due to unidentified influenza virus with other respiratory manifestations: Secondary | ICD-10-CM | POA: Diagnosis not present

## 2015-09-30 DIAGNOSIS — Z862 Personal history of diseases of the blood and blood-forming organs and certain disorders involving the immune mechanism: Secondary | ICD-10-CM | POA: Insufficient documentation

## 2015-09-30 DIAGNOSIS — Z8719 Personal history of other diseases of the digestive system: Secondary | ICD-10-CM | POA: Insufficient documentation

## 2015-09-30 DIAGNOSIS — Z79899 Other long term (current) drug therapy: Secondary | ICD-10-CM | POA: Insufficient documentation

## 2015-09-30 DIAGNOSIS — R05 Cough: Secondary | ICD-10-CM | POA: Diagnosis present

## 2015-09-30 MED ORDER — ACETAMINOPHEN 325 MG PO TABS
650.0000 mg | ORAL_TABLET | Freq: Once | ORAL | Status: AC
  Administered 2015-09-30: 650 mg via ORAL
  Filled 2015-09-30: qty 2

## 2015-09-30 NOTE — ED Notes (Signed)
Patient reports that she had had fever chills and ached with ear ache x 2 -3. The patient took tylenol at home for a fever of 102

## 2015-10-01 ENCOUNTER — Emergency Department (HOSPITAL_BASED_OUTPATIENT_CLINIC_OR_DEPARTMENT_OTHER)
Admission: EM | Admit: 2015-10-01 | Discharge: 2015-10-01 | Disposition: A | Discharge status: Home / Self Care | Payer: Medicaid Other | Attending: Emergency Medicine | Admitting: Emergency Medicine

## 2015-10-01 DIAGNOSIS — H672 Otitis media in diseases classified elsewhere, left ear: Secondary | ICD-10-CM

## 2015-10-01 DIAGNOSIS — J111 Influenza due to unidentified influenza virus with other respiratory manifestations: Secondary | ICD-10-CM

## 2015-10-01 MED ORDER — AZITHROMYCIN 250 MG PO TABS: ORAL_TABLET | ORAL | Status: AC

## 2015-10-01 MED ORDER — HYDROCOD POLST-CPM POLST ER 10-8 MG/5ML PO SUER
5.0000 mL | Freq: Once | ORAL | Status: AC
  Administered 2015-10-01: 5 mL via ORAL
  Filled 2015-10-01: qty 5

## 2015-10-01 MED ORDER — HYDROCOD POLST-CPM POLST ER 10-8 MG/5ML PO SUER: 5.0000 mL | Freq: Two times a day (BID) | ORAL | Status: AC | PRN

## 2015-10-01 NOTE — ED Notes (Signed)
C/o generalized body aches, chills, fever ha and cough x 2 days

## 2015-10-01 NOTE — ED Provider Notes (Signed)
CSN: [id number]     Arrival date & time 09/30/15  2010 History   First MD Initiated Contact with Patient 10/01/15 [phone]     Chief Complaint  Patient presents with  . Cough     (Consider location/radiation/quality/duration/timing/severity/associated sxs/prior Treatment) HPI  This is a 35 year old female with a two-day history of fever, chills, headache and cough. She has been taking Tylenol without adequate relief. She denies shortness of breath or wheezing. She denies nausea, vomiting or diarrhea. She denies significant nasal congestion but is having a scratchy throat. She is also having left ear pain which she rates a 5 out of 10.  Past Medical History  Diagnosis Date  . GERD (gastroesophageal reflux disease)   . Gestational hypertension   . Anemia    Past Surgical History  Procedure Laterality Date  . Cesarean section    . Breast enhancement surgery  2001  . Hysteroscopy w/d&c  09/14/2011    Procedure: DILATATION AND CURETTAGE /HYSTEROSCOPY;  Surgeon: Waverly Ferrarianiel H. Christell ConstantMoore, MD;  Location: [facility];  Service: Gynecology;  Laterality: N/A;  . Iud removal  09/14/2011    Procedure: INTRAUTERINE DEVICE (IUD) REMOVAL;  Surgeon: Waverly Ferrarianiel H. Christell ConstantMoore, MD;  Location: [facility];  Service: Gynecology;  Laterality: N/A;   Family History  Problem Relation Age of Onset  . Diabetes Father   . Heart disease Father   . Stroke Paternal Grandmother    Social History  Substance Use Topics  . Smoking status: Never Smoker   . Smokeless tobacco: Never Used  . Alcohol Use: No   OB History    Gravida Para Term Preterm AB TAB SAB Ectopic Multiple Living   6 2 2  3  3   2      Review of Systems  All other systems reviewed and are negative.   Allergies  Morphine and related  Home Medications   Prior to Admission medications   Medication Sig Start Date End Date Taking? Authorizing Provider  Prenatal Vit-Fe Fumarate-FA (PRENATAL MULTIVITAMIN) TABS tablet Take 1 tablet by mouth daily at 12 noon.    Historical  Provider, MD   BP 130/80 mmHg  Pulse 103  Temp(Src) 98.9 F (37.2 C) (Oral)  Resp 18  Ht 5\' 2"  (1.575 m)  Wt 248 lb (112.492 kg)  BMI 45.35 kg/m2  SpO2 99%  LMP 08/31/2015  Breastfeeding? Unknown   Physical Exam  General: Well-developed, well-nourished female in no acute distress; appearance consistent with age of record HENT: normocephalic; atraumatic; pharynx normal; right TM normal; left TM erythematous Eyes: pupils equal, round and reactive to light; extraocular muscles intact Neck: supple Heart: regular rate and rhythm Lungs: clear to auscultation bilaterally Abdomen: soft; nondistended; nontender; no masses or hepatosplenomegaly; bowel sounds present Extremities: No deformity; full range of motion; pulses normal Neurologic: Awake, alert and oriented; motor function intact in all extremities and symmetric; no facial droop Skin: Warm and dry Psychiatric: Normal mood and affect    ED Course  Procedures (including critical care time)   MDM  Nursing notes and vitals signs, including pulse oximetry, reviewed.  Summary of this visit's results, reviewed by myself:  Imaging Studies: Dg Chest 2 View  09/30/2015  CLINICAL DATA:  Cough, chest pain, shortness of breath, fever, body aches, and chills for 2 days. EXAM: CHEST  2 VIEW COMPARISON:  07/14/2014 FINDINGS: The heart size and mediastinal contours are within normal limits. Both lungs are clear. The visualized skeletal structures are unremarkable. IMPRESSION: No active cardiopulmonary disease. Electronically Signed  By: Burman Nieves M.D.   On: 09/30/2015 21:57      Paula Libra, MD 10/01/15 [phone]

## 2015-10-13 ENCOUNTER — Inpatient Hospital Stay (HOSPITAL_COMMUNITY)
Admission: AD | Admit: 2015-10-13 | Discharge: 2015-10-13 | Disposition: A | Discharge status: Home / Self Care | Payer: Medicaid Other | Source: Ambulatory Visit | Attending: Family Medicine | Admitting: Family Medicine

## 2015-10-13 ENCOUNTER — Encounter (HOSPITAL_COMMUNITY): Payer: Self-pay | Admitting: *Deleted

## 2015-10-13 DIAGNOSIS — Z79899 Other long term (current) drug therapy: Secondary | ICD-10-CM | POA: Insufficient documentation

## 2015-10-13 DIAGNOSIS — K219 Gastro-esophageal reflux disease without esophagitis: Secondary | ICD-10-CM | POA: Insufficient documentation

## 2015-10-13 DIAGNOSIS — R109 Unspecified abdominal pain: Secondary | ICD-10-CM | POA: Insufficient documentation

## 2015-10-13 DIAGNOSIS — Z3202 Encounter for pregnancy test, result negative: Secondary | ICD-10-CM

## 2015-10-13 LAB — CBC
HEMATOCRIT: 33.2 % — AB (ref 36.0–46.0)
Hemoglobin: 10.5 g/dL — ABNORMAL LOW (ref 12.0–15.0)
MCH: 26.9 pg (ref 26.0–34.0)
MCHC: 31.6 g/dL (ref 30.0–36.0)
MCV: 84.9 fL (ref 78.0–100.0)
PLATELETS: 413 10*3/uL — AB (ref 150–400)
RBC: 3.91 MIL/uL (ref 3.87–5.11)
RDW: 14.8 % (ref 11.5–15.5)
WBC: 7 10*3/uL (ref 4.0–10.5)

## 2015-10-13 LAB — URINE MICROSCOPIC-ADD ON

## 2015-10-13 LAB — URINALYSIS, ROUTINE W REFLEX MICROSCOPIC
Bilirubin Urine: NEGATIVE
GLUCOSE, UA: NEGATIVE mg/dL
KETONES UR: 15 mg/dL — AB
LEUKOCYTES UA: NEGATIVE
NITRITE: NEGATIVE
PH: 6 (ref 5.0–8.0)
Protein, ur: NEGATIVE mg/dL
SPECIFIC GRAVITY, URINE: 1.025 (ref 1.005–1.030)

## 2015-10-13 LAB — POCT PREGNANCY, URINE: Preg Test, Ur: NEGATIVE

## 2015-10-13 LAB — HCG, QUANTITATIVE, PREGNANCY: hCG, Beta Chain, Quant, S: <1 m[IU]/mL (ref <5)

## 2015-10-13 NOTE — MAU Provider Note (Signed)
History     CSN: [id number]  Arrival date and time: 10/13/15 1540   None        Chief Complaint  Patient presents with  . Abdominal Pain  . Possible Pregnancy   HPI Kimberly Douglas is a 35 y.o. [id number] who presents for abdominal cramping & possible pregnancy. Was seen in ED last month for miscarriage. States she had a "faint" positive pregnancy test on Saturday and again this morning. Reports period-like abdominal cramping throughout the weekend. Rates pain 3/10. Denies vaginal bleeding or discharge.   OB History    Gravida Para Term Preterm AB TAB SAB Ectopic Multiple Living   Past Medical History  Diagnosis Date  . GERD (gastroesophageal reflux disease)   . Gestational hypertension   . Anemia     Past Surgical History  Procedure Laterality Date  . Cesarean section    . Breast enhancement surgery  2001  . Hysteroscopy w/d&c  09/14/2011    Procedure: DILATATION AND CURETTAGE /HYSTEROSCOPY;  Surgeon: Waverly Ferrari. Christell Constant, MD;  Location: [facility];  Service: Gynecology;  Laterality: N/A;  . Iud removal  09/14/2011    Procedure: INTRAUTERINE DEVICE (IUD) REMOVAL;  Surgeon: Waverly Ferrari. Christell Constant, MD;  Location: [facility];  Service: Gynecology;  Laterality: N/A;    Family History  Problem Relation Age of Onset  . Diabetes Father   . Heart disease Father   . Stroke Paternal Grandmother     Social History  Substance Use Topics  . Smoking status: Never Smoker   . Smokeless tobacco: Never Used  . Alcohol Use: No    Allergies:  Allergies  Allergen Reactions  . Morphine And Related Other (See Comments)    "burns me up inside"    Prescriptions prior to admission  Medication Sig Dispense Refill Last Dose  . ibuprofen (ADVIL,MOTRIN) 200 MG tablet Take 800 mg by mouth every 6 (six) hours as needed for headache.   Past Month at Unknown time  . Prenatal Vit-Fe Fumarate-FA (PRENATAL MULTIVITAMIN) TABS tablet Take 1 tablet by mouth daily at 12 noon.   Past Month at Unknown  time  . azithromycin (ZITHROMAX Z-PAK) 250 MG tablet 2 po day one, then 1 daily x 4 days (Patient not taking: Reported on 10/13/2015) 6 tablet 0 Completed Course at Unknown time  . chlorpheniramine-HYDROcodone (TUSSIONEX PENNKINETIC ER) 10-8 MG/5ML SUER Take 5 mLs by mouth every 12 (twelve) hours as needed. (Patient not taking: Reported on 10/13/2015) 70 mL 0 Not Taking at Unknown time    Review of Systems  Constitutional: Negative.   Gastrointestinal: Positive for abdominal pain. Negative for nausea, vomiting, diarrhea and constipation.  Genitourinary: Negative.    Physical Exam   Blood pressure 143/82, pulse 90, temperature 98.6 F (37 C), temperature source Oral, resp. rate 18, weight 256 lb 9.6 oz (116.393 kg), last menstrual period 09/07/2015, SpO2 100 %, unknown if currently breastfeeding.  Physical Exam  Nursing note and vitals reviewed. Constitutional: She is oriented to person, place, and time. She appears well-developed and well-nourished. No distress.  HENT:  Head: Normocephalic and atraumatic.  Eyes: Conjunctivae are normal. Right eye exhibits no discharge. Left eye exhibits no discharge. No scleral icterus.  Neck: Normal range of motion.  Cardiovascular: Normal rate.   Respiratory: Effort normal. No respiratory distress.  Neurological: She is alert and oriented to person, place, and time.  Skin: Skin is warm and dry. She is  not diaphoretic.  Psychiatric: She has a normal mood and affect. Her behavior is normal. Judgment and thought content normal.    MAU Course  Procedures Results for orders placed or performed during the hospital encounter of 10/13/15 (from the past 24 hour(s))  Urinalysis, Routine w reflex microscopic (not at [facility])     Status: Abnormal   Collection Time: 10/13/15  3:45 PM  Result Value Ref Range   Color, Urine YELLOW YELLOW   APPearance CLEAR CLEAR   Specific Gravity, Urine 1.025 1.005 - 1.030   pH 6.0 5.0 - 8.0   Glucose, UA NEGATIVE NEGATIVE mg/dL    Hgb urine dipstick TRACE (A) NEGATIVE   Bilirubin Urine NEGATIVE NEGATIVE   Ketones, ur 15 (A) NEGATIVE mg/dL   Protein, ur NEGATIVE NEGATIVE mg/dL   Nitrite NEGATIVE NEGATIVE   Leukocytes, UA NEGATIVE NEGATIVE  Urine microscopic-add on     Status: Abnormal   Collection Time: 10/13/15  3:45 PM  Result Value Ref Range   Squamous Epithelial / LPF 0-5 (A) NONE SEEN   WBC, UA 0-5 0 - 5 WBC/hpf   RBC / HPF 0-5 0 - 5 RBC/hpf   Bacteria, UA FEW (A) NONE SEEN   Urine-Other MUCOUS PRESENT   Pregnancy, urine POC     Status: None   Collection Time: 10/13/15  3:53 PM  Result Value Ref Range   Preg Test, Ur NEGATIVE NEGATIVE  CBC     Status: Abnormal   Collection Time: 10/13/15  4:30 PM  Result Value Ref Range   WBC 7.0 4.0 - 10.5 K/uL   RBC 3.91 3.87 - 5.11 MIL/uL   Hemoglobin 10.5 (L) 12.0 - 15.0 g/dL   HCT 40.933.2 (L) 81.136.0 - 91.446.0 %   MCV 84.9 78.0 - 100.0 fL   MCH 26.9 26.0 - 34.0 pg   MCHC 31.6 30.0 - 36.0 g/dL   RDW 78.214.8 95.611.5 - 21.315.5 %   Platelets 413 (H) 150 - 400 K/uL  hCG, quantitative, pregnancy     Status: None   Collection Time: 10/13/15  4:30 PM  Result Value Ref Range   hCG, Beta Chain, Quant, S <1 <5 mIU/mL    MDM UPT negative BHCG negative   Assessment and Plan  A: 1. Negative pregnancy test   2. Abdominal cramping     P: Discharge home Discussed reasons to return Repeat HPT in >1 week if no cycle  Judeth HornErin Shalise Rosado 10/13/2015, 4:32 PM

## 2015-10-13 NOTE — MAU Note (Signed)
Patient had a miscarriage on 09/04/15 and states her quant level went down to 0.  Her LMP was 09/07/15.  She had a faint positive HPT yesterday and presents today with lower abdominal pain.  "I feel pregnant."  No other complaints.  No vaginal pain or discharge.

## 2015-10-13 NOTE — Discharge Instructions (Signed)
Pregnancy Test Information °WHAT IS A PREGNANCY TEST? °A pregnancy test is used to detect the presence of human chorionic gonadotropin (hCG) in a sample of your urine or blood. hCG is a hormone produced by the cells of the placenta. The placenta is the organ that forms to nourish and support a developing baby. °This test requires a sample of either blood or urine. A pregnancy test determines whether you are pregnant or not. °HOW ARE PREGNANCY TESTS DONE? °Pregnancy tests are done using a home pregnancy test or having a blood or urine test done at your health care provider's office.  °Home pregnancy tests require a urine sample. °· Most kits use a plastic testing device with a strip of paper that indicates whether there is hCG in your urine. °· Follow the test instructions very carefully. °· After you urinate on the test stick, markings will appear to let you know whether you are pregnant. °· For best results, use your first urine of the morning. That is when the concentration of hCG is highest. °Having a blood test to check for pregnancy requires a sample of blood drawn from a vein in your hand or arm. Your health care provider will send your sample to a lab for testing. Results of a pregnancy test will be positive or negative. °IS ONE TYPE OF PREGNANCY TEST BETTER THAN ANOTHER? °In some cases, a blood test will return a positive result even if a urine test was negative because blood tests are more sensitive. This means blood tests can detect hCG earlier than home pregnancy tests.  °HOW ACCURATE ARE HOME PREGNANCY TESTS?  °Both types of pregnancy tests are very accurate. °· A blood test is about 98% accurate. °· When you are far enough along in your pregnancy and when used correctly, home pregnancy tests are equally accurate. °CAN ANYTHING INTERFERE WITH HOME PREGNANCY TEST RESULTS?  °It is possible for certain conditions to cause an inaccurate test result (false positive or false negative). °· A false positive is a  positive test result when you are not pregnant. This can happen if you: °¨ Are taking certain medicines, including anticonvulsants or tranquilizers. °¨ Have certain proteins in your blood. °· A false negative is a negative test result when you are pregnant. This can happen if you: °¨ Took the test before there was enough hCG to detect. A pregnancy test will not be positive in most women until 3-4 weeks after conception. °¨ Drank a lot of liquid before the test. Diluted urine samples can sometimes give an inaccurate result. °¨ Take certain medicines, such as water pills (diuretics) or some antihistamines. °WHAT SHOULD I DO IF I HAVE A POSITIVE PREGNANCY TEST? °If you have a positive pregnancy test, schedule an appointment with your health care provider. You might need additional testing to confirm the pregnancy. In the meantime, begin taking a prenatal vitamin, stop smoking, stop drinking alcohol, and do not use street drugs. °Talk to your health care provider about how to take care of yourself during your pregnancy. Ask about what to expect from the care you will need throughout pregnancy (prenatal care). °  °This information is not intended to replace advice given to you by your health care provider. Make sure you discuss any questions you have with your health care provider. °  °Document Released: 07/01/2003 Document Revised: 07/19/2014 Document Reviewed: 10/23/2013 °Elsevier Interactive Patient Education ©2016 [employer] ° °

## 2017-10-23 IMAGING — CR DG CHEST 2V
2 series · 2 of 2 positions shown · non-contrast
Comparison: 07/14/2014

CLINICAL DATA: Cough, chest pain, shortness of breath, fever, body
aches, and chills for 2 days.

EXAM:
CHEST  2 VIEW

[w chest pa]
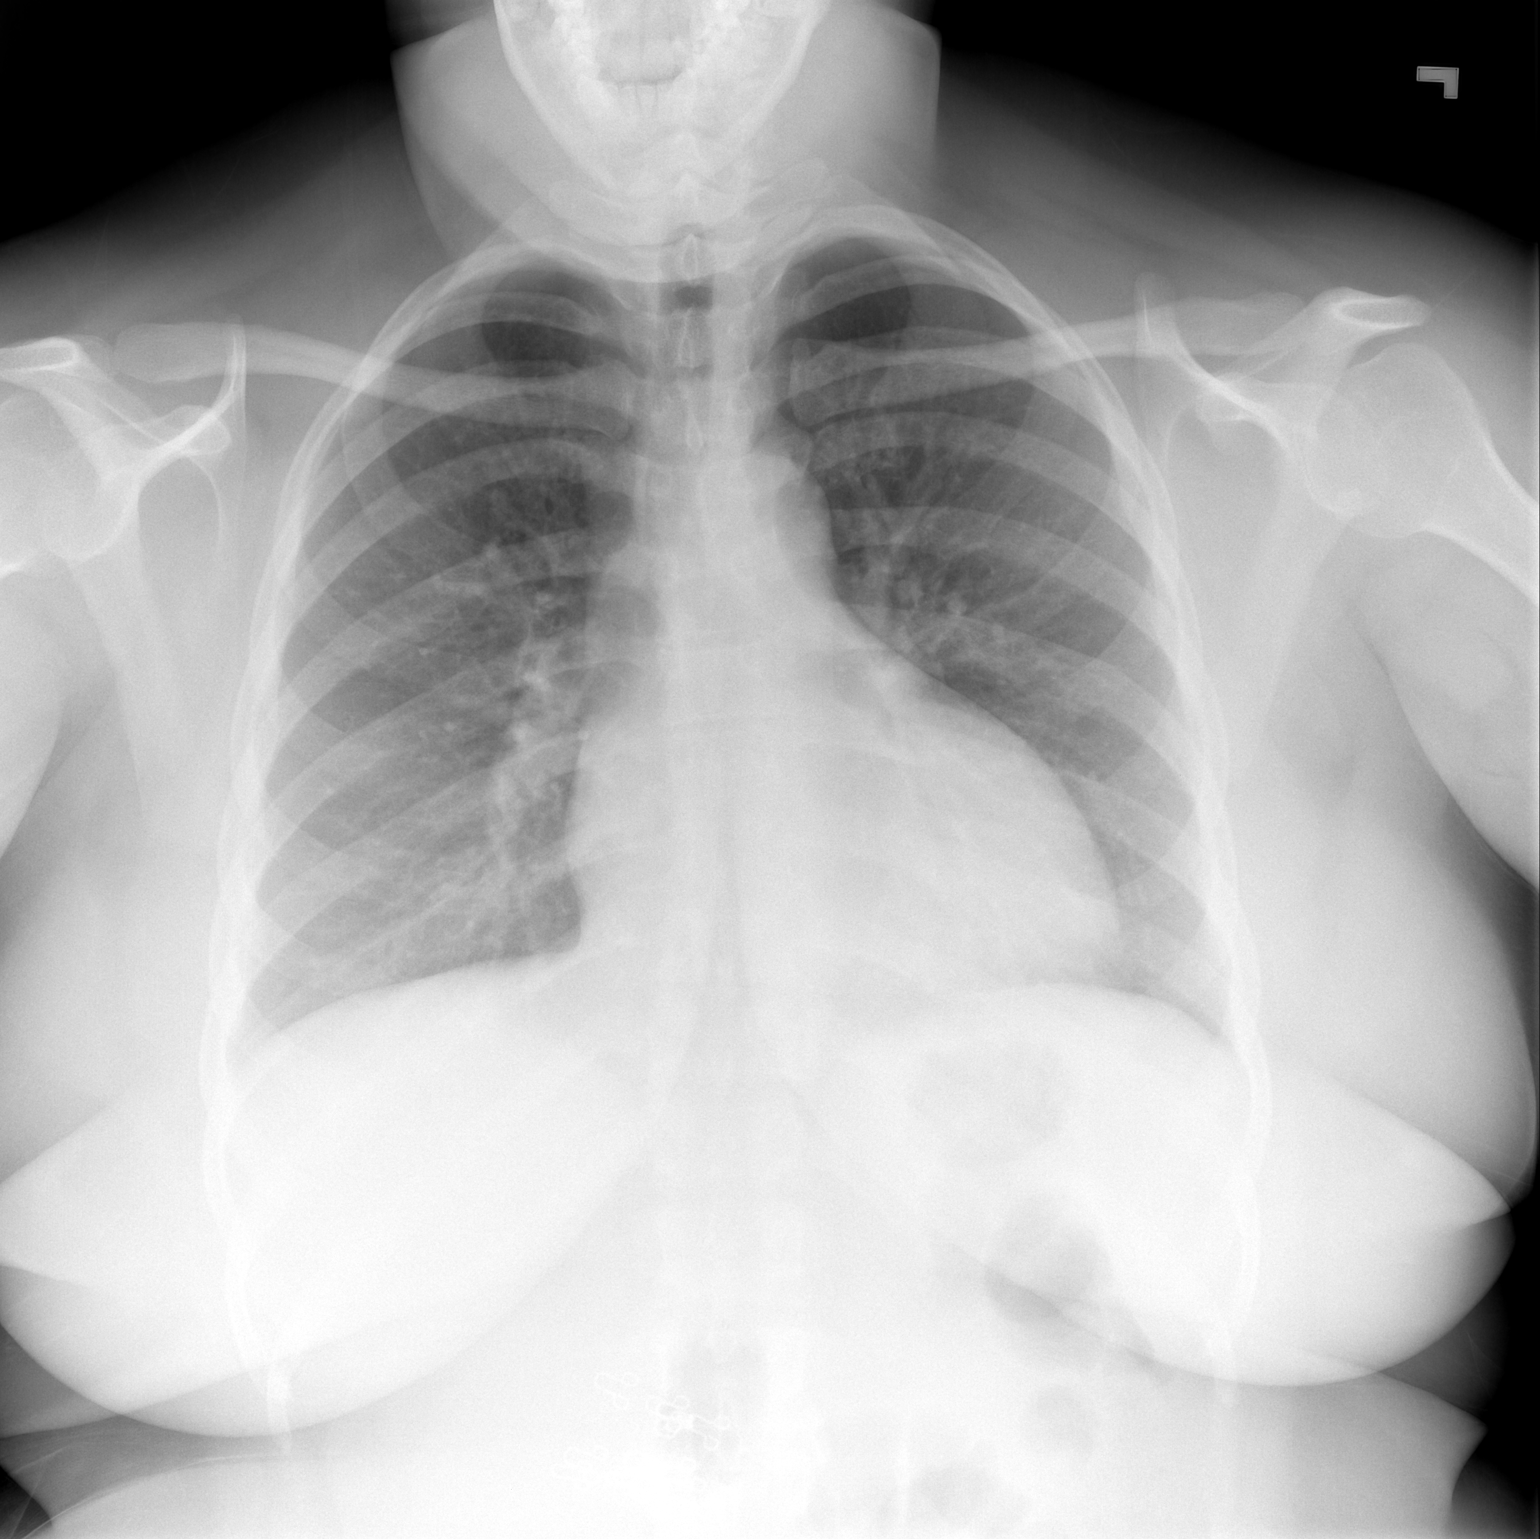

[w chest lat]
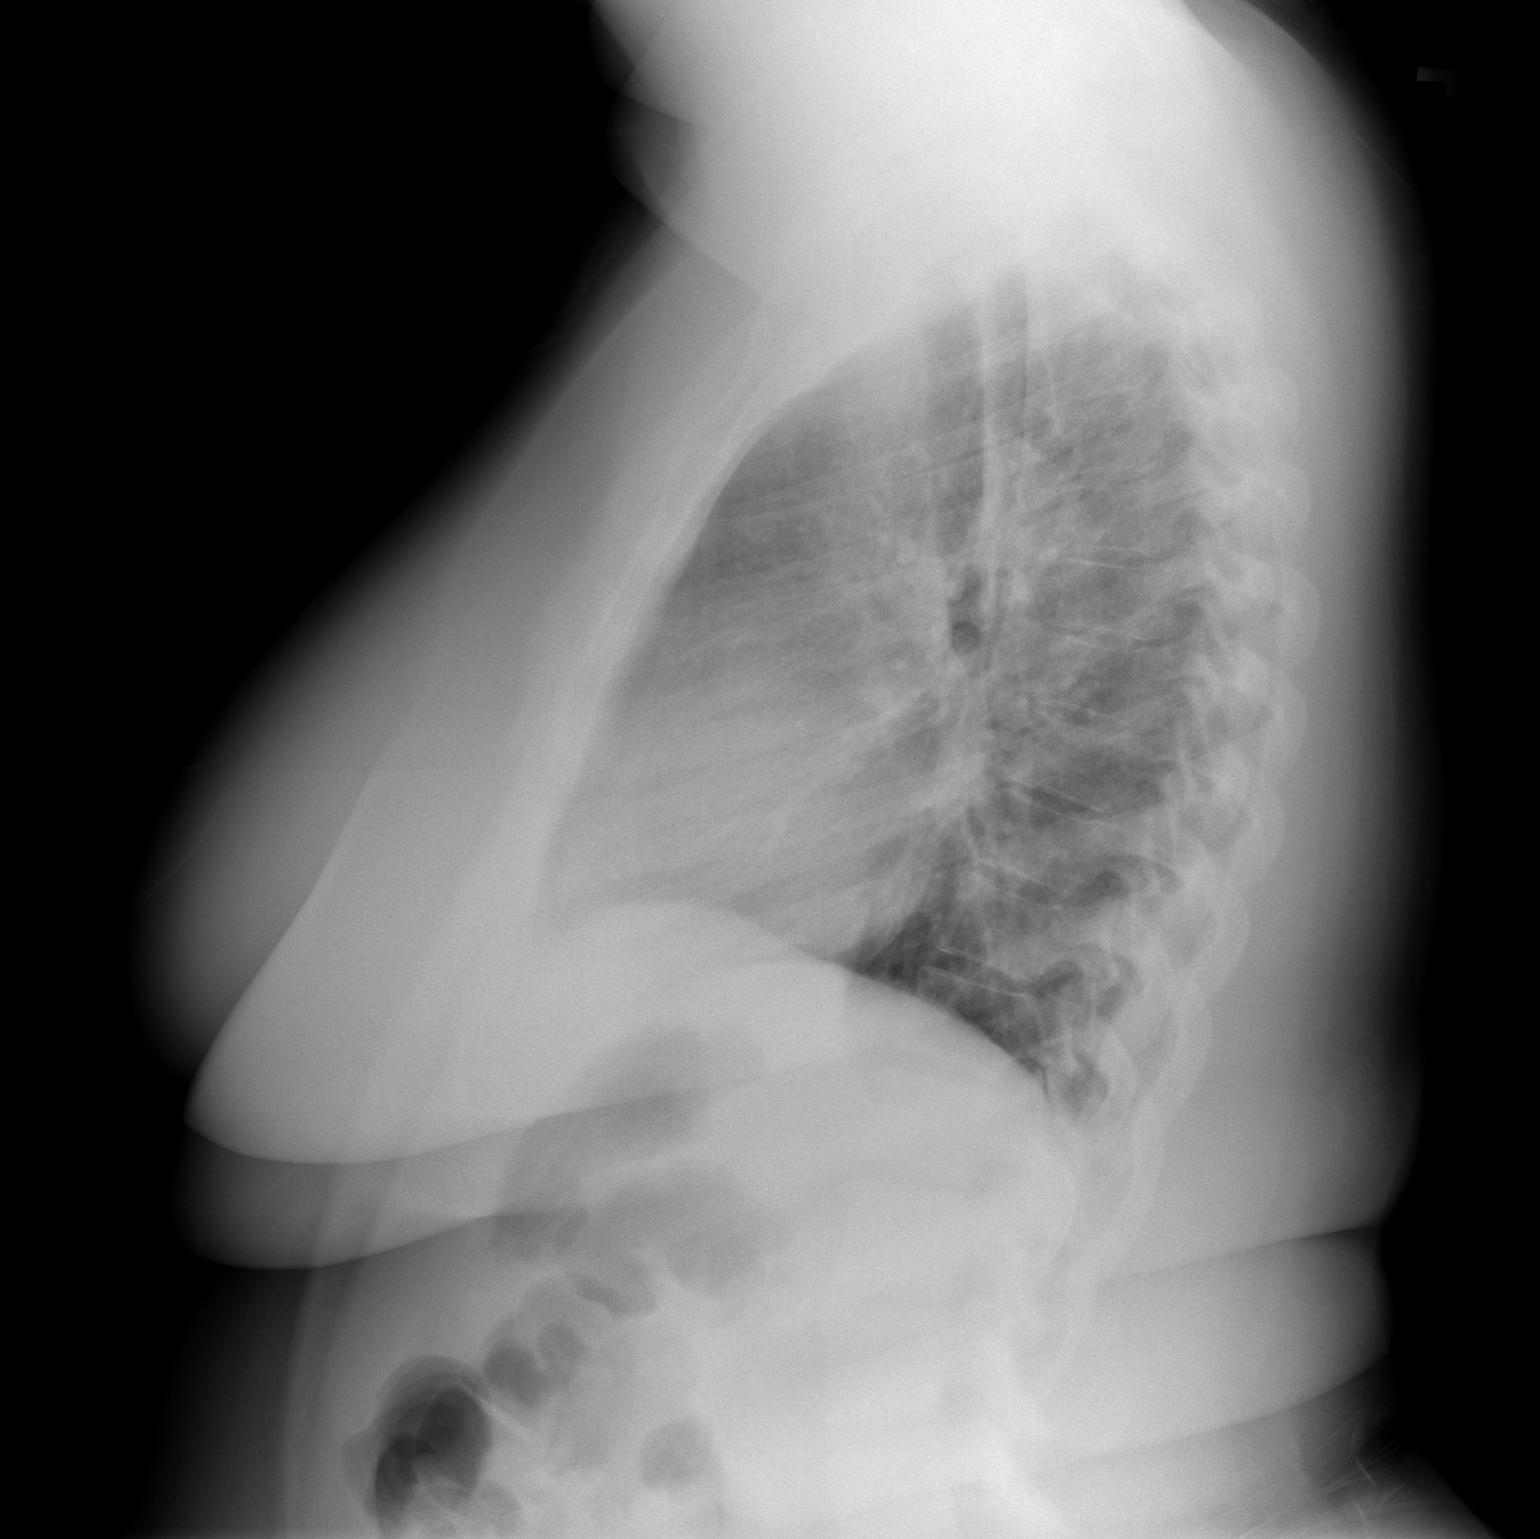

[2 of 2 positions shown; findings below may reference images not displayed]

FINDINGS: The heart size and mediastinal contours are within normal limits.
Both lungs are clear. The visualized skeletal structures are
unremarkable.
IMPRESSION: No active cardiopulmonary disease.
